# Patient Record
Sex: Female | Born: 1990 | Race: White | Hispanic: No | Marital: Married | Smoking: Never smoker
Health system: Southern US, Community
[De-identification: ages and names within clinical notes are randomized; demographics above are authoritative.]

## PROBLEM LIST (undated history)

## (undated) ENCOUNTER — Inpatient Hospital Stay (HOSPITAL_COMMUNITY): Payer: Self-pay

## (undated) DIAGNOSIS — O020 Blighted ovum and nonhydatidiform mole: Secondary | ICD-10-CM

## (undated) DIAGNOSIS — Z789 Other specified health status: Secondary | ICD-10-CM

## (undated) HISTORY — PX: NO PAST SURGERIES: SHX2092

---

## 2016-06-30 NOTE — L&D Delivery Note (Signed)
Operative Delivery Note At 12:02 AM a viable and healthy female was delivered via Vaginal, [employer], operational(Extractor).  Presentation: vertex; Position: Left,, Occiput,, Anterior; Station: +4.  Verbal consent: obtained from patient.  Risks and benefits discussed in detail.  Risks include, but are not limited to the risks of anesthesia, bleeding, infection, damage to maternal tissues, fetal cephalhematoma.  There is also the risk of inability to effect vaginal delivery of the head, or shoulder dystocia that cannot be resolved by established maneuvers, leading to the need for emergency cesarean section.  APGAR: 8, 9; weight  pending.   Placenta status: spontaneous, intact.   Cord:  with the following complications: none.  Cord pH: na  Anesthesia:  local Instruments: kiwi x 2 pulls, no pop offs Episiotomy:  midline Lacerations:  none Suture Repair: 2.0 vicryl Est. Blood Loss (mL):  500  Mom to postpartum.  Baby to Couplet care / Skin to Skin.  Diasia Henken J 06/02/2017, 12:10 AM

## 2016-07-30 ENCOUNTER — Encounter (HOSPITAL_COMMUNITY): Payer: Self-pay

## 2016-07-30 ENCOUNTER — Inpatient Hospital Stay (HOSPITAL_COMMUNITY): Payer: PRIVATE HEALTH INSURANCE

## 2016-07-30 ENCOUNTER — Inpatient Hospital Stay (HOSPITAL_COMMUNITY)
Admission: AD | Admit: 2016-07-30 | Discharge: 2016-07-30 | Disposition: A | Discharge status: Home / Self Care | Payer: PRIVATE HEALTH INSURANCE | Source: Ambulatory Visit | Attending: Obstetrics and Gynecology | Admitting: Obstetrics and Gynecology

## 2016-07-30 DIAGNOSIS — O209 Hemorrhage in early pregnancy, unspecified: Secondary | ICD-10-CM | POA: Diagnosis not present

## 2016-07-30 DIAGNOSIS — O3680X0 Pregnancy with inconclusive fetal viability, not applicable or unspecified: Secondary | ICD-10-CM

## 2016-07-30 DIAGNOSIS — Z3A01 Less than 8 weeks gestation of pregnancy: Secondary | ICD-10-CM | POA: Diagnosis not present

## 2016-07-30 HISTORY — DX: Other specified health status: Z78.9

## 2016-07-30 LAB — URINALYSIS, ROUTINE W REFLEX MICROSCOPIC
BACTERIA UA: NONE SEEN
Bilirubin Urine: NEGATIVE
Glucose, UA: NEGATIVE mg/dL
Ketones, ur: 80 mg/dL — AB
Leukocytes, UA: NEGATIVE
Nitrite: NEGATIVE
Protein, ur: 30 mg/dL — AB
SPECIFIC GRAVITY, URINE: 1.025 (ref 1.005–1.030)
pH: 5 (ref 5.0–8.0)

## 2016-07-30 LAB — INFLUENZA PANEL BY PCR (TYPE A & B)
INFLAPCR: NEGATIVE
Influenza B By PCR: NEGATIVE

## 2016-07-30 LAB — CBC
HCT: 40 % (ref 36.0–46.0)
Hemoglobin: 14.1 g/dL (ref 12.0–15.0)
MCH: 33 pg (ref 26.0–34.0)
MCHC: 35.3 g/dL (ref 30.0–36.0)
MCV: 93.7 fL (ref 78.0–100.0)
PLATELETS: 194 10*3/uL (ref 150–400)
RBC: 4.27 MIL/uL (ref 3.87–5.11)
RDW: 12.2 % (ref 11.5–15.5)
WBC: 6.5 10*3/uL (ref 4.0–10.5)

## 2016-07-30 LAB — ABO/RH: ABO/RH(D): O POS

## 2016-07-30 LAB — HCG, QUANTITATIVE, PREGNANCY: hCG, Beta Chain, Quant, S: 25 m[IU]/mL — ABNORMAL HIGH (ref <5)

## 2016-07-30 LAB — POCT PREGNANCY, URINE: Preg Test, Ur: POSITIVE — AB

## 2016-07-30 MED ORDER — DEXTROSE 5 % IN LACTATED RINGERS IV BOLUS: 1000.0000 mL | Freq: Once | INTRAVENOUS | Status: DC

## 2016-07-30 NOTE — MAU Provider Note (Signed)
History     CSN: [id number]  Arrival date and time: 07/30/16 1716   First Provider Initiated Contact with Patient 07/30/16 1829      Chief Complaint  Patient presents with  . Vaginal Bleeding   HPI   Ms.Tammy Holmes is a 26 y.o. female G1P0 @ 3175w6d presenting to MAU with vaginal bleeding and abdominal pain. The pain is located in her lower abdomen and feels like menstrual cramping. Her bleeding is bright red and similar to a menstrual cycle.   The patient also reports a 4 day history of worsening soar throat, malaise, nasal congestion, chills, and productive cough.  Her cough is productive with clear mucus..She reports chills, but never specifically checked her temperature. She reports reduced PO intake since becoming ill. She recalls sick contacts, and has not had her flu vaccine done.   OB History    Gravida Para Term Preterm AB Living   1             SAB TAB Ectopic Multiple Live Births                  Past Medical History:  Diagnosis Date  . Medical history non-contributory     Past Surgical History:  Procedure Laterality Date  . NO PAST SURGERIES      History reviewed. No pertinent family history.  Social History  Substance Use Topics  . Smoking status: Never Smoker  . Smokeless tobacco: Never Used  . Alcohol use No    Allergies: No Known Allergies  Prescriptions Prior to Admission  Medication Sig Dispense Refill Last Dose  . Prenatal Vit-Fe Fumarate-FA (PRENATAL MULTIVITAMIN) TABS tablet Take 1 tablet by mouth daily at 12 noon.   07/30/2016 at Unknown time   Results for orders placed or performed during the hospital encounter of 07/30/16 (from the past 48 hour(s))  Urinalysis, Routine w reflex microscopic     Status: Abnormal   Collection Time: 07/30/16  5:30 PM  Result Value Ref Range   Color, Urine YELLOW YELLOW   APPearance HAZY (A) CLEAR   Specific Gravity, Urine 1.025 1.005 - 1.030   pH 5.0 5.0 - 8.0   Glucose, UA NEGATIVE NEGATIVE mg/dL   Hgb  urine dipstick LARGE (A) NEGATIVE   Bilirubin Urine NEGATIVE NEGATIVE   Ketones, ur 80 (A) NEGATIVE mg/dL   Protein, ur 30 (A) NEGATIVE mg/dL   Nitrite NEGATIVE NEGATIVE   Leukocytes, UA NEGATIVE NEGATIVE   RBC / HPF TOO NUMEROUS TO COUNT 0 - 5 RBC/hpf   WBC, UA 0-5 0 - 5 WBC/hpf   Bacteria, UA NONE SEEN NONE SEEN   Squamous Epithelial / LPF 0-5 (A) NONE SEEN   Mucous PRESENT   Pregnancy, urine POC     Status: Abnormal   Collection Time: 07/30/16  5:57 PM  Result Value Ref Range   Preg Test, Ur POSITIVE (A) NEGATIVE    Comment:        THE SENSITIVITY OF THIS METHODOLOGY IS >24 mIU/mL   CBC Once     Status: None   Collection Time: 07/30/16  7:09 PM  Result Value Ref Range   WBC 6.5 4.0 - 10.5 K/uL   RBC 4.27 3.87 - 5.11 MIL/uL   Hemoglobin 14.1 12.0 - 15.0 g/dL   HCT 40.940.0 81.136.0 - 91.446.0 %   MCV 93.7 78.0 - 100.0 fL   MCH 33.0 26.0 - 34.0 pg   MCHC 35.3 30.0 - 36.0 g/dL   RDW 78.212.2 95.611.5 -  15.5 %   Platelets 194 150 - 400 K/uL  ABO/Rh     Status: None (Preliminary result)   Collection Time: 07/30/16  7:09 PM  Result Value Ref Range   ABO/RH(D) O POS   hCG, quantitative, pregnancy     Status: Abnormal   Collection Time: 07/30/16  7:09 PM  Result Value Ref Range   hCG, Beta Chain, Quant, S 25 (H) <5 mIU/mL    Comment:          GEST. AGE      CONC.  (mIU/mL)   <=1 WEEK        5 - 50     2 WEEKS       50 - 500     3 WEEKS       100 - 10,000     4 WEEKS     1,000 - 30,000     5 WEEKS     3,500 - 115,000   6-8 WEEKS     12,000 - 270,000    12 WEEKS     15,000 - 220,000        FEMALE AND NON-PREGNANT FEMALE:     LESS THAN 5 mIU/mL   Influenza panel by PCR (type A & B)     Status: None   Collection Time: 07/30/16  7:17 PM  Result Value Ref Range   Influenza A By PCR NEGATIVE NEGATIVE   Influenza B By PCR NEGATIVE NEGATIVE    Comment: (NOTE) The Xpert Xpress Flu assay is intended as an aid in the diagnosis of  influenza and should not be used as a sole basis for treatment.   This  assay is FDA approved for nasopharyngeal swab specimens only. Nasal  washings and aspirates are unacceptable for Xpert Xpress Flu testing.     Review of Systems  Constitutional: Positive for chills. Negative for fever.  Gastrointestinal: Positive for abdominal pain.   Physical Exam   Blood pressure 149/79, pulse 114, temperature 99 F (37.2 C), temperature source Oral, resp. rate 18, last menstrual period 06/19/2016.  Physical Exam  Constitutional: She is oriented to person, place, and time. She appears well-developed and well-nourished. No distress.  HENT:  Head: Normocephalic.  Eyes: Pupils are equal, round, and reactive to light.  Genitourinary:  Genitourinary Comments: Bimanual exam: slightly open, anterior. Small amount of blood noted on exam glove.   Musculoskeletal: Normal range of motion.  Neurological: She is alert and oriented to person, place, and time.  Skin: Skin is warm. She is not diaphoretic.  Psychiatric: Her behavior is normal.    MAU Course  Procedures  None  MDM  Urine shows > 80 ketones. Patient refused IV. PO hydration in MAU ABO Quant CBC Flu swab- negative  [place]   Report given to [place] CNM who assumes care of the patient.   Duane Lope, NP   US Ob Comp Less 14 Wks  Result Date: 07/30/2016 CLINICAL DATA:  Acute onset of vaginal bleeding.  Initial encounter. EXAM: OBSTETRIC <14 WK [place] AND TRANSVAGINAL OB US TECHNIQUE: Both transabdominal and transvaginal ultrasound examinations were performed for complete evaluation of the gestation as well as the maternal uterus, adnexal regions, and pelvic cul-de-sac. Transvaginal technique was performed to assess early pregnancy. COMPARISON:  None. FINDINGS: Intrauterine gestational sac: None seen. Yolk sac:  N/A Embryo:  N/A Subchorionic hemorrhage:  None visualized. Maternal uterus/adnexae: The uterus is unremarkable in appearance. The endometrial echo complex measures 5 mm the thickness.  The  ovaries are within normal limits. The right ovary measures 2.9 x 1.9 x 2.1 cm, while the left ovary measures 3.1 x 2.6 x 1.4 cm. No suspicious adnexal masses are seen; there is no evidence for ovarian torsion. Trace free fluid is seen within the pelvic cul-de-sac. IMPRESSION: No intrauterine gestational sac seen. No evidence for ectopic pregnancy. The endometrial echo complex measures 5 mm in thickness, suggesting that the patient is past the luteal phase of the cycle. Electronically Signed   By: Roanna Raider M.D.   On: 07/30/2016 20:27   US Ob Transvaginal  Result Date: 07/30/2016 CLINICAL DATA:  Acute onset of vaginal bleeding.  Initial encounter. EXAM: OBSTETRIC <14 WK [place] AND TRANSVAGINAL OB US TECHNIQUE: Both transabdominal and transvaginal ultrasound examinations were performed for complete evaluation of the gestation as well as the maternal uterus, adnexal regions, and pelvic cul-de-sac. Transvaginal technique was performed to assess early pregnancy. COMPARISON:  None. FINDINGS: Intrauterine gestational sac: None seen. Yolk sac:  N/A Embryo:  N/A Subchorionic hemorrhage:  None visualized. Maternal uterus/adnexae: The uterus is unremarkable in appearance. The endometrial echo complex measures 5 mm the thickness. The ovaries are within normal limits. The right ovary measures 2.9 x 1.9 x 2.1 cm, while the left ovary measures 3.1 x 2.6 x 1.4 cm. No suspicious adnexal masses are seen; there is no evidence for ovarian torsion. Trace free fluid is seen within the pelvic cul-de-sac. IMPRESSION: No intrauterine gestational sac seen. No evidence for ectopic pregnancy. The endometrial echo complex measures 5 mm in thickness, suggesting that the patient is past the luteal phase of the cycle. Electronically Signed   By: Roanna Raider M.D.   On: 07/30/2016 20:27     Assessment and Plan   1. Pregnancy of unknown anatomic location   2. Vaginal bleeding in pregnancy, first trimester    Plan: D/C home in stable  condition.  Ectopic/SAB precautions Follow-up Information    [facility] [place] [facility] ADMISSIONS Follow up on 08/02/2016.   Why:  for repeat bloodwork or sooner as needed in emergencies Contact information: [address] [id number] mc [place] [address] [phone]         Allergies as of 07/30/2016   No Known Allergies     Medication List    TAKE these medications   prenatal multivitamin Tabs tablet Take 1 tablet by mouth daily at 12 noon.         [place], CNM 07/30/2016 9:08 PM

## 2016-07-30 NOTE — Discharge Instructions (Signed)
Threatened Miscarriage °A threatened miscarriage occurs when you have vaginal bleeding during your first 20 weeks of pregnancy but the pregnancy has not ended. If you have vaginal bleeding during this time, your health care provider will do tests to make sure you are still pregnant. If the tests show you are still pregnant and the developing baby (fetus) inside your womb (uterus) is still growing, your condition is considered a threatened miscarriage. °A threatened miscarriage does not mean your pregnancy will end, but it does increase the risk of losing your pregnancy (complete miscarriage). °What are the causes? °The cause of a threatened miscarriage is usually not known. If you go on to have a complete miscarriage, the most common cause is an abnormal number of chromosomes in the developing baby. Chromosomes are the structures inside cells that hold all your genetic material. °Some causes of vaginal bleeding that do not result in miscarriage include: °· Having sex. °· Having an infection. °· Normal hormone changes of pregnancy. °· Bleeding that occurs when an egg implants in your uterus. °What increases the risk? °Risk factors for bleeding in early pregnancy include: °· Obesity. °· Smoking. °· Drinking excessive amounts of alcohol or caffeine. °· Recreational drug use. °What are the signs or symptoms? °· Light vaginal bleeding. °· Mild abdominal pain or cramps. °How is this diagnosed? °If you have bleeding with or without abdominal pain before 20 weeks of pregnancy, your health care provider will do tests to check whether you are still pregnant. One important test involves using sound waves and a computer (ultrasound) to create images of the inside of your uterus. Other tests include an internal exam of your vagina and uterus (pelvic exam) and measurement of your baby’s heart rate. °You may be diagnosed with a threatened miscarriage if: °· Ultrasound testing shows you are still pregnant. °· Your baby’s heart rate  is strong. °· A pelvic exam shows that the opening between your uterus and your vagina (cervix) is closed. °· Your heart rate and blood pressure are stable. °· Blood tests confirm you are still pregnant. °How is this treated? °No treatments have been shown to prevent a threatened miscarriage from going on to a complete miscarriage. However, the right home care is important. °Follow these instructions at home: °· Make sure you keep all your appointments for prenatal care. This is very important. °· Get plenty of rest. °· Do not have sex or use tampons if you have vaginal bleeding. °· Do not douche. °· Do not smoke or use recreational drugs. °· Do not drink alcohol. °· Avoid caffeine. °Contact a health care provider if: °· You have light vaginal bleeding or spotting while pregnant. °· You have abdominal pain or cramping. °· You have a fever. °Get help right away if: °· You have heavy vaginal bleeding. °· You have blood clots coming from your vagina. °· You have severe low back pain or abdominal cramps. °· You have fever, chills, and severe abdominal pain. °This information is not intended to replace advice given to you by your health care provider. Make sure you discuss any questions you have with your health care provider. °Document Released: 06/16/2005 Document Revised: 11/22/2015 Document Reviewed: 04/12/2013 °Elsevier Interactive Patient Education © 2017 [employer] ° °

## 2016-07-30 NOTE — MAU Note (Signed)
Pt presents to MAU with vaginal bleeding that began 2 days ago. Reports yesterday she passed a "clot/tissue,"and now her bleeding seems to be like a normal period. Pt had 2 +HPT about a week ago.  Pt also reports she thinks she has a sinus infection.

## 2016-07-30 NOTE — MAU Provider Note (Cosign Needed)
Chief Complaint:  Vaginal Bleeding   First Provider Initiated Contact with Patient 07/30/16 1829      HPI: Tammy Holmes is a 26 y.o. G1P0 at 6233w6d who presents with her partner at bedside to maternity admissions reporting lower abdominal cramping, vaginal bleeding, and sinus infection. On 1/20, the patient reports she had 2x positive home pregnancy test and reports her LMP to be 06/19/2016. She developed 4/10 lower abdominal cramping & bright red vaginal bleeding on 1/29. Initially the bleeding was slightly above spotting but worsen to greater than her normal menses by the morning of 1/30 & passed a clot this evening. The patient denies dysuria, hematuria, concerning vaginal discharge, vaginal itch/pain, and BM changes.   The patient also reports a 4 day history of worsening soar throat, malaise, nasal congestion, chills, and productive cough. She reports being woken up by chills, post nasal drip, and diaphoresis at night. Her cough is productive for clear mucus. She reports a bitemporal headache and sinus pressure & nasal congestion localized near her nasal bridge which is relieved by applying pressure. She reports tactile fever, but never specifically checked her temperature. She reports reduced PO intake since becoming ill. She recalls sick contacts at her job and has not had her flu vaccine done.    Denies contractions, leakage of fluid or vaginal bleeding. Good fetal movement.   Pregnancy Course:   Past Medical History: Past Medical History:  Diagnosis Date  . Medical history non-contributory     Past obstetric history: OB History  Gravida Para Term Preterm AB Living  1            SAB TAB Ectopic Multiple Live Births               # Outcome Date GA Lbr Len/2nd Weight Sex Delivery Anes PTL Lv  1 Current               Past Surgical History: Past Surgical History:  Procedure Laterality Date  . NO PAST SURGERIES       Family History: History reviewed. No pertinent family  history.  Social History: Social History  Substance Use Topics  . Smoking status: Never Smoker  . Smokeless tobacco: Never Used  . Alcohol use No    Allergies: No Known Allergies  Meds:  Prescriptions Prior to Admission  Medication Sig Dispense Refill Last Dose  . Prenatal Vit-Fe Fumarate-FA (PRENATAL MULTIVITAMIN) TABS tablet Take 1 tablet by mouth daily at 12 noon.   07/30/2016 at Unknown time    I have reviewed patient's Past Medical Hx, Surgical Hx, Family Hx, Social Hx, medications and allergies.   ROS:  A comprehensive ROS was negative except per HPI.    Physical Exam   Patient Vitals for the past 24 hrs:  BP Temp Temp src Pulse Resp  07/30/16 1913 127/87 - - 87 18  07/30/16 1745 149/79 99 F (37.2 C) Oral 114 18   Constitutional: Well-developed, well-nourished female in no acute distress.  HEENT: MMM, Pink Oropharynx without erythema or exudate, no post nasal drip,  no TTP over maxillary or ethmoidal sinuses bilaterally. Cardiovascular: normal rate Respiratory: normal effort GI: Abd soft, non-tender, involuntary guarding with deep palpation in lower quadrants.  Pos BS x 4 MS: Extremities nontender, no edema, normal ROM Neurologic: Alert and oriented x 3.     Labs: Results for orders placed or performed during the hospital encounter of 07/30/16 (from the past 24 hour(s))  Urinalysis, Routine w reflex microscopic  Status: Abnormal   Collection Time: 07/30/16  5:30 PM  Result Value Ref Range   Color, Urine YELLOW YELLOW   APPearance HAZY (A) CLEAR   Specific Gravity, Urine 1.025 1.005 - 1.030   pH 5.0 5.0 - 8.0   Glucose, UA NEGATIVE NEGATIVE mg/dL   Hgb urine dipstick LARGE (A) NEGATIVE   Bilirubin Urine NEGATIVE NEGATIVE   Ketones, ur 80 (A) NEGATIVE mg/dL   Protein, ur 30 (A) NEGATIVE mg/dL   Nitrite NEGATIVE NEGATIVE   Leukocytes, UA NEGATIVE NEGATIVE   RBC / HPF TOO NUMEROUS TO COUNT 0 - 5 RBC/hpf   WBC, UA 0-5 0 - 5 WBC/hpf   Bacteria, UA NONE  SEEN NONE SEEN   Squamous Epithelial / LPF 0-5 (A) NONE SEEN   Mucous PRESENT   Pregnancy, urine POC     Status: Abnormal   Collection Time: 07/30/16  5:57 PM  Result Value Ref Range   Preg Test, Ur POSITIVE (A) NEGATIVE    Imaging:  No results found.  MAU Course: Patient received work up for ectopic pregnancy, STI, UTI and  influenza infection. Held IVF to allow patient to attempt oral rehydration. Patient brought passed clot into office, will have pathology assess for fetal tissue.   MDM: Plan of care reviewed with patient, including labs and tests ordered and medical treatment.   Assessment: 1. Vaginal bleeding in pregnancy, first trimester   2. Vaginal bleeding in pregnancy, first trimester     Plan: Discharge home in stable condition.      Collie Siad, Medical Student MS3 07/30/2016 7:34 PM

## 2016-07-31 LAB — HIV ANTIBODY (ROUTINE TESTING W REFLEX): HIV SCREEN 4TH GENERATION: NONREACTIVE

## 2016-08-01 ENCOUNTER — Inpatient Hospital Stay (HOSPITAL_COMMUNITY)
Admission: AD | Admit: 2016-08-01 | Discharge: 2016-08-01 | Disposition: A | Discharge status: Home / Self Care | Payer: No Typology Code available for payment source | Source: Ambulatory Visit | Attending: Obstetrics and Gynecology | Admitting: Obstetrics and Gynecology

## 2016-08-01 DIAGNOSIS — O208 Other hemorrhage in early pregnancy: Secondary | ICD-10-CM | POA: Diagnosis present

## 2016-08-01 DIAGNOSIS — O039 Complete or unspecified spontaneous abortion without complication: Secondary | ICD-10-CM | POA: Diagnosis not present

## 2016-08-01 DIAGNOSIS — Z3A01 Less than 8 weeks gestation of pregnancy: Secondary | ICD-10-CM | POA: Insufficient documentation

## 2016-08-01 LAB — HCG, QUANTITATIVE, PREGNANCY: hCG, Beta Chain, Quant, S: 10 m[IU]/mL — ABNORMAL HIGH (ref <5)

## 2016-08-01 NOTE — Discharge Instructions (Signed)

## 2016-08-01 NOTE — MAU Note (Signed)
Pt here for repeat BHCG, is bleeding today like a light period, continues to have cramping/belly ache.

## 2016-08-01 NOTE — Progress Notes (Signed)
Pt not in lobby.  Called pt to discuss results. Left voicemail to return call.

## 2016-08-01 NOTE — MAU Provider Note (Signed)
History   [id number]   Chief Complaint  Patient presents with  . Follow-up    HPI Tammy Holmes is a 26 y.o. female G1P0 here for follow-up BHCG.  Upon review of the records patient was first seen on 1/31 for vaginal bleeding. BHCG on that day was 25.  Ultrasound showed no IUP or adnexal mass. Pt discharged home in stable condition to f/u in 48 hrs for repeat BHCG. Pt here today with no report of abdominal pain and some vaginal bleeding.   All other systems negative.   Patient's last menstrual period was 06/19/2016.  OB History  Gravida Para Term Preterm AB Living  1            SAB TAB Ectopic Multiple Live Births               # Outcome Date GA Lbr Len/2nd Weight Sex Delivery Anes PTL Lv  1 Current               Past Medical History:  Diagnosis Date  . Medical history non-contributory     No family history on file.  Social History   Social History  . Marital status: Married    Spouse name: N/A  . Number of children: N/A  . Years of education: N/A   Social History Main Topics  . Smoking status: Never Smoker  . Smokeless tobacco: Never Used  . Alcohol use No  . Drug use: No  . Sexual activity: Yes   Other Topics Concern  . Not on file   Social History Narrative  . No narrative on file    No Known Allergies  No current facility-administered medications on file prior to encounter.    Current Outpatient Prescriptions on File Prior to Encounter  Medication Sig Dispense Refill  . Prenatal Vit-Fe Fumarate-FA (PRENATAL MULTIVITAMIN) TABS tablet Take 1 tablet by mouth daily at 12 noon.       Physical Exam   Vitals:   08/01/16 1718  BP: 116/67  Pulse: 71  Resp: 16  Temp: 98.2 F (36.8 C)  TempSrc: Oral    Physical Exam  Nursing note and vitals reviewed. Constitutional: She is oriented to person, place, and time. She appears well-developed and well-nourished. No distress.  HENT:  Head: Normocephalic and atraumatic.  Eyes: Conjunctivae are normal.  Right eye exhibits no discharge. Left eye exhibits no discharge. No scleral icterus.  Neck: Normal range of motion.  Respiratory: Effort normal. No respiratory distress.  Neurological: She is alert and oriented to person, place, and time.  Skin: Skin is warm and dry. She is not diaphoretic.  Psychiatric: She has a normal mood and affect. Her behavior is normal. Judgment and thought content normal.    MAU Course  Procedures Results for orders placed or performed during the hospital encounter of 08/01/16 (from the past 24 hour(s))  hCG, quantitative, pregnancy     Status: Abnormal   Collection Time: 08/01/16  4:53 PM  Result Value Ref Range   hCG, Beta Chain, Quant, S 10 (H) <5 mIU/mL    MDM Component     Latest Ref Rng & Units 07/30/2016 08/01/2016  HCG, Beta Chain, Quant, S     <5 mIU/mL 25 (H) 10 (H)   Drop in BHCG indicative of SAB. Will have pt f/u at [facility] in a week for BHCG  Assessment and Plan  26 y.o. G1P0 at [redacted]w[redacted]d wks Pregnancy Follow-up BHCG 1. Miscarriage    P: [facility]charge [facility] bhcg in 1 week Pelvic rest Discussed reasons to return to MAU   Judeth HornErin Kinzlee Selvy, NP 08/01/2016 5:57 PM

## 2016-08-07 ENCOUNTER — Inpatient Hospital Stay (HOSPITAL_COMMUNITY)
Admission: AD | Admit: 2016-08-07 | Discharge: 2016-08-07 | Disposition: A | Discharge status: Home / Self Care | Payer: No Typology Code available for payment source | Source: Ambulatory Visit | Attending: Obstetrics and Gynecology | Admitting: Obstetrics and Gynecology

## 2016-08-07 ENCOUNTER — Encounter (HOSPITAL_COMMUNITY): Payer: Self-pay | Admitting: Student

## 2016-08-07 ENCOUNTER — Other Ambulatory Visit: Payer: No Typology Code available for payment source

## 2016-08-07 DIAGNOSIS — Z3202 Encounter for pregnancy test, result negative: Secondary | ICD-10-CM | POA: Diagnosis not present

## 2016-08-07 DIAGNOSIS — Z3A01 Less than 8 weeks gestation of pregnancy: Secondary | ICD-10-CM | POA: Insufficient documentation

## 2016-08-07 DIAGNOSIS — O039 Complete or unspecified spontaneous abortion without complication: Secondary | ICD-10-CM | POA: Insufficient documentation

## 2016-08-07 LAB — HCG, QUANTITATIVE, PREGNANCY: HCG, BETA CHAIN, QUANT, S: 2 m[IU]/mL (ref <5)

## 2016-08-07 NOTE — Discharge Instructions (Signed)
Miscarriage A miscarriage is the loss of an unborn baby (fetus) before the 20th week of pregnancy. The cause is often unknown. Follow these instructions at home:  You may need to stay in bed (bed rest), or you may be able to do light activity. Go about activity as told by your doctor.  Have help at home.  Write down how many pads you use each day. Write down how soaked they are.  Do not use tampons. Do not wash out your vagina (douche) or have sex (intercourse) until your doctor approves.  Only take medicine as told by your doctor.  Do not take aspirin.  Keep all doctor visits as told.  If you or your partner have problems with grieving, talk to your doctor. You can also try counseling. Give yourself time to grieve before trying to get pregnant again. Get help right away if:  You have bad cramps or pain in your back or belly (abdomen).  You have a fever.  You pass large clumps of blood (clots) from your vagina that are walnut-sized or larger. Save the clumps for your doctor to see.  You pass large amounts of tissue from your vagina. Save the tissue for your doctor to see.  You have more bleeding.  You have thick, bad-smelling fluid (discharge) coming from the vagina.  You get lightheaded, weak, or you pass out (faint).  You have chills. This information is not intended to replace advice given to you by your health care provider. Make sure you discuss any questions you have with your health care provider. Document Released: 09/08/2011 Document Revised: 11/22/2015 Document Reviewed: 07/17/2011 Elsevier Interactive Patient Education  2017 [place] [employer], Female Introduction Adopting a healthy lifestyle and getting preventive care can go a long way to promote health and wellness. Talk with your health care provider about what schedule of regular examinations is right for you. This is a good chance for you to check in with your provider about disease prevention  and staying healthy. In between checkups, there are plenty of things you can do on your own. Experts have done a lot of research about which lifestyle changes and preventive measures are most likely to keep you healthy. Ask your health care provider for more information. Weight and diet Eat a healthy diet  Be sure to include plenty of vegetables, fruits, low-fat dairy products, and lean protein.  Do not eat a lot of foods high in solid fats, added sugars, or salt.  Get regular exercise. This is one of the most important things you can do for your health.  Most adults should exercise for at least 150 minutes each week. The exercise should increase your heart rate and make you sweat (moderate-intensity exercise).  Most adults should also do strengthening exercises at least twice a week. This is in addition to the moderate-intensity exercise. Maintain a healthy weight  Body mass index (BMI) is a measurement that can be used to identify possible weight problems. It estimates body fat based on height and weight. Your health care provider can help determine your BMI and help you achieve or maintain a healthy weight.  For females 34 years of age and older:  A BMI below 18.5 is considered underweight.  A BMI of 18.5 to 24.9 is normal.  A BMI of 25 to 29.9 is considered overweight.  A BMI of 30 and above is considered obese. Watch levels of cholesterol and blood lipids  You should start having your blood tested for  lipids and cholesterol at 25 years of age, then have this test every 5 years.  You may need to have your cholesterol levels checked more often if:  Your lipid or cholesterol levels are high.  You are older than 25 years of age.  You are at high risk for heart disease. Cancer screening Lung Cancer  Lung cancer screening is recommended for adults 61-79 years old who are at high risk for lung cancer because of a history of smoking.  A yearly low-dose CT scan of the lungs is  recommended for people who:  Currently smoke.  Have quit within the past 15 years.  Have at least a 30-pack-year history of smoking. A pack year is smoking an average of one pack of cigarettes a day for 1 year.  Yearly screening should continue until it has been 15 years since you quit.  Yearly screening should stop if you develop a health problem that would prevent you from having lung cancer treatment. Breast Cancer  Practice breast self-awareness. This means understanding how your breasts normally appear and feel.  It also means doing regular breast self-exams. Let your health care provider know about any changes, no matter how small.  If you are in your 20s or 30s, you should have a clinical breast exam (CBE) by a health care provider every 1-3 years as part of a regular health exam.  If you are 73 or older, have a CBE every year. Also consider having a breast X-ray (mammogram) every year.  If you have a family history of breast cancer, talk to your health care provider about genetic screening.  If you are at high risk for breast cancer, talk to your health care provider about having an MRI and a mammogram every year.  Breast cancer gene (BRCA) assessment is recommended for women who have family members with BRCA-related cancers. BRCA-related cancers include:  Breast.  Ovarian.  Tubal.  Peritoneal cancers.  Results of the assessment will determine the need for genetic counseling and BRCA1 and BRCA2 testing. Cervical Cancer  Your health care provider may recommend that you be screened regularly for cancer of the pelvic organs (ovaries, uterus, and vagina). This screening involves a pelvic examination, including checking for microscopic changes to the surface of your cervix (Pap test). You may be encouraged to have this screening done every 3 years, beginning at age 66.  For women ages 28-65, health care providers may recommend pelvic exams and Pap testing every 3 years, or  they may recommend the Pap and pelvic exam, combined with testing for human papilloma virus (HPV), every 5 years. Some types of HPV increase your risk of cervical cancer. Testing for HPV may also be done on women of any age with unclear Pap test results.  Other health care providers may not recommend any screening for nonpregnant women who are considered low risk for pelvic cancer and who do not have symptoms. Ask your health care provider if a screening pelvic exam is right for you.  If you have had past treatment for cervical cancer or a condition that could lead to cancer, you need Pap tests and screening for cancer for at least 20 years after your treatment. If Pap tests have been discontinued, your risk factors (such as having a new sexual partner) need to be reassessed to determine if screening should resume. Some women have medical problems that increase the chance of getting cervical cancer. In these cases, your health care provider may recommend more frequent screening  and Pap tests. Colorectal Cancer  This type of cancer can be detected and often prevented.  Routine colorectal cancer screening usually begins at 25 years of age and continues through 25 years of age.  Your health care provider may recommend screening at an earlier age if you have risk factors for colon cancer.  Your health care provider may also recommend using home test kits to check for hidden blood in the stool.  A small camera at the end of a tube can be used to examine your colon directly (sigmoidoscopy or colonoscopy). This is done to check for the earliest forms of colorectal cancer.  Routine screening usually begins at age 61.  Direct examination of the colon should be repeated every 5-10 years through 25 years of age. However, you may need to be screened more often if early forms of precancerous polyps or small growths are found. Skin Cancer  Check your skin from head to toe regularly.  Tell your health care  provider about any new moles or changes in moles, especially if there is a change in a mole's shape or color.  Also tell your health care provider if you have a mole that is larger than the size of a pencil eraser.  Always use sunscreen. Apply sunscreen liberally and repeatedly throughout the day.  Protect yourself by wearing long sleeves, pants, a wide-brimmed hat, and sunglasses whenever you are outside. Heart disease, diabetes, and high blood pressure  High blood pressure causes heart disease and increases the risk of stroke. High blood pressure is more likely to develop in:  People who have blood pressure in the high end of the normal range (130-139/85-89 mm Hg).  People who are overweight or obese.  People who are African American.  If you are 96-13 years of age, have your blood pressure checked every 3-5 years. If you are 43 years of age or older, have your blood pressure checked every year. You should have your blood pressure measured twice--once when you are at a hospital or clinic, and once when you are not at a hospital or clinic. Record the average of the two measurements. To check your blood pressure when you are not at a hospital or clinic, you can use:  An automated blood pressure machine at a pharmacy.  A home blood pressure monitor.  If you are between 46 years and 59 years old, ask your health care provider if you should take aspirin to prevent strokes.  Have regular diabetes screenings. This involves taking a blood sample to check your fasting blood sugar level.  If you are at a normal weight and have a low risk for diabetes, have this test once every three years after 25 years of age.  If you are overweight and have a high risk for diabetes, consider being tested at a younger age or more often. Preventing infection Hepatitis B  If you have a higher risk for hepatitis B, you should be screened for this virus. You are considered at high risk for hepatitis B  if:  You were born in a country where hepatitis B is common. Ask your health care provider which countries are considered high risk.  Your parents were born in a high-risk country, and you have not been immunized against hepatitis B (hepatitis B vaccine).  You have HIV or AIDS.  You use needles to inject street drugs.  You live with someone who has hepatitis B.  You have had sex with someone who has hepatitis B.  You get hemodialysis treatment.  You take certain medicines for conditions, including cancer, organ transplantation, and autoimmune conditions. Hepatitis C  Blood testing is recommended for:  Everyone born from [birth year] through 1964.  Anyone with known risk factors for hepatitis C. Sexually transmitted infections (STIs)  You should be screened for sexually transmitted infections (STIs) including gonorrhea and chlamydia if:  You are sexually active and are younger than 25 years of age.  You are older than 25 years of age and your health care provider tells you that you are at risk for this type of infection.  Your sexual activity has changed since you were last screened and you are at an increased risk for chlamydia or gonorrhea. Ask your health care provider if you are at risk.  If you do not have HIV, but are at risk, it may be recommended that you take a prescription medicine daily to prevent HIV infection. This is called pre-exposure prophylaxis (PrEP). You are considered at risk if:  You are sexually active and do not regularly use condoms or know the HIV status of your partner(s).  You take drugs by injection.  You are sexually active with a partner who has HIV. Talk with your health care provider about whether you are at high risk of being infected with HIV. If you choose to begin PrEP, you should first be tested for HIV. You should then be tested every 3 months for as long as you are taking PrEP. Pregnancy  If you are premenopausal and you may become pregnant,  ask your health care provider about preconception counseling.  If you may become pregnant, take 400 to 800 micrograms (mcg) of folic acid every day.  If you want to prevent pregnancy, talk to your health care provider about birth control (contraception). Osteoporosis and menopause  Osteoporosis is a disease in which the bones lose minerals and strength with aging. This can result in serious bone fractures. Your risk for osteoporosis can be identified using a bone density scan.  If you are 90 years of age or older, or if you are at risk for osteoporosis and fractures, ask your health care provider if you should be screened.  Ask your health care provider whether you should take a calcium or vitamin D supplement to lower your risk for osteoporosis.  Menopause may have certain physical symptoms and risks.  Hormone replacement therapy may reduce some of these symptoms and risks. Talk to your health care provider about whether hormone replacement therapy is right for you. Follow these instructions at home:  Schedule regular health, dental, and eye exams.  Stay current with your immunizations.  Do not use any tobacco products including cigarettes, chewing tobacco, or electronic cigarettes.  If you are pregnant, do not drink alcohol.  If you are breastfeeding, limit how much and how often you drink alcohol.  Limit alcohol intake to no more than 1 drink per day for nonpregnant women. One drink equals 12 ounces of beer, 5 ounces of wine, or 1 ounces of hard liquor.  Do not use street drugs.  Do not share needles.  Ask your health care provider for help if you need support or information about quitting drugs.  Tell your health care provider if you often feel depressed.  Tell your health care provider if you have ever been abused or do not feel safe at home. This information is not intended to replace advice given to you by your health care provider. Make sure you discuss any questions  you have with your health care provider. Document Released: 12/30/2010 Document Revised: 11/22/2015 Document Reviewed: 03/20/2015  2017 Elsevier

## 2016-08-07 NOTE — MAU Provider Note (Signed)
History   [id number]   Chief Complaint  Patient presents with  . Follow-up    HPI Tammy Holmes is a 26 y.o. female G1P0 here for follow-up BHCG.  Upon review of the records patient was first seen on 1/31 for vaginal bleeding. BHCG on that day was 25.  Ultrasound showed no IUP or adnexal mass. Pt discharged home in stable condition, to f/u for repeat BHCG. Pt here today with no report of abdominal pain or vaginal bleeding. All other systems negative.  Last seen in MAU on 2/2. BHCG was 10.    Patient's last menstrual period was 06/19/2016.  OB History  Gravida Para Term Preterm AB Living  1            SAB TAB Ectopic Multiple Live Births               # Outcome Date GA Lbr Len/2nd Weight Sex Delivery Anes PTL Lv  1 Current               Past Medical History:  Diagnosis Date  . Medical history non-contributory     No family history on file.  Social History   Social History  . Marital status: Married    Spouse name: N/A  . Number of children: N/A  . Years of education: N/A   Social History Main Topics  . Smoking status: Never Smoker  . Smokeless tobacco: Never Used  . Alcohol use No  . Drug use: No  . Sexual activity: Yes   Other Topics Concern  . Not on file   Social History Narrative  . No narrative on file    No Known Allergies  No current facility-administered medications on file prior to encounter.    Current Outpatient Prescriptions on File Prior to Encounter  Medication Sig Dispense Refill  . Prenatal Vit-Fe Fumarate-FA (PRENATAL MULTIVITAMIN) TABS tablet Take 1 tablet by mouth daily at 12 noon.       Physical Exam   Vitals:   08/07/16 1630  BP: 104/66  Pulse: 64  Resp: 18  Temp: 98.6 F (37 C)    Physical Exam  Nursing note and vitals reviewed. Constitutional: She is oriented to person, place, and time. She appears well-developed and well-nourished. No distress.  HENT:  Head: Normocephalic and atraumatic.  Eyes: Conjunctivae are  normal. Right eye exhibits no discharge. Left eye exhibits no discharge. No scleral icterus.  Neck: Normal range of motion.  Respiratory: Effort normal. No respiratory distress.  Musculoskeletal: Normal range of motion.  Neurological: She is alert and oriented to person, place, and time.  Skin: Skin is warm and dry. She is not diaphoretic.  Psychiatric: She has a normal mood and affect. Her behavior is normal. Judgment and thought content normal.    MAU Course  Procedures Results for orders placed or performed during the hospital encounter of 08/07/16 (from the past 24 hour(s))  hCG, quantitative, pregnancy     Status: None   Collection Time: 08/07/16  4:29 PM  Result Value Ref Range   hCG, Beta Chain, Quant, S 2 <5 mIU/mL    MDM O positive Component     Latest Ref Rng & Units 07/30/2016 08/01/2016 08/07/2016  HCG, Beta Chain, Quant, S     <5 mIU/mL 25 (H) 10 (H) 2    Assessment and Plan  26 y.o. G1P0 at [redacted]w[redacted]d wks Pregnancy Follow-up BHCG 1. Negative pregnancy test   2. Miscarriage     P: Discharge home Ob/gyn  provider list given  F/u with PCP or gyn for routine care   Judeth HornErin Elizjah Noblet, NP 08/07/2016 6:10 PM

## 2016-08-07 NOTE — MAU Note (Signed)
Pt here for follow up BHCG. Pt reports bleeding is moe brown today. Having some cramping today. Unsure if it is worse than it was the other day.

## 2016-12-25 LAB — OB RESULTS CONSOLE RUBELLA ANTIBODY, IGM
RUBELLA: IMMUNE
Rubella: IMMUNE

## 2016-12-25 LAB — OB RESULTS CONSOLE HIV ANTIBODY (ROUTINE TESTING)
HIV: NONREACTIVE
HIV: NONREACTIVE

## 2016-12-25 LAB — OB RESULTS CONSOLE HEPATITIS B SURFACE ANTIGEN
HEP B S AG: NEGATIVE
HEP B S AG: NEGATIVE

## 2016-12-25 LAB — OB RESULTS CONSOLE ABO/RH: RH TYPE: POSITIVE

## 2016-12-25 LAB — OB RESULTS CONSOLE ANTIBODY SCREEN: Antibody Screen: NEGATIVE

## 2016-12-25 LAB — OB RESULTS CONSOLE RPR
RPR: NONREACTIVE
RPR: NONREACTIVE

## 2016-12-25 LAB — OB RESULTS CONSOLE GC/CHLAMYDIA
CHLAMYDIA, DNA PROBE: NEGATIVE
Gonorrhea: NEGATIVE

## 2017-05-08 LAB — OB RESULTS CONSOLE GBS: GBS: POSITIVE

## 2017-06-01 ENCOUNTER — Encounter (HOSPITAL_COMMUNITY): Payer: Self-pay | Admitting: Anesthesiology

## 2017-06-01 ENCOUNTER — Inpatient Hospital Stay (HOSPITAL_COMMUNITY)
Admission: AD | Admit: 2017-06-01 | Discharge: 2017-06-04 | DRG: 807 | Disposition: A | Discharge status: Home / Self Care | Payer: No Typology Code available for payment source | Source: Ambulatory Visit | Attending: Obstetrics and Gynecology | Admitting: Obstetrics and Gynecology

## 2017-06-01 ENCOUNTER — Encounter (HOSPITAL_COMMUNITY): Payer: Self-pay | Admitting: General Practice

## 2017-06-01 DIAGNOSIS — O99824 Streptococcus B carrier state complicating childbirth: Secondary | ICD-10-CM | POA: Diagnosis present

## 2017-06-01 DIAGNOSIS — O1204 Gestational edema, complicating childbirth: Secondary | ICD-10-CM | POA: Diagnosis present

## 2017-06-01 DIAGNOSIS — Z3A39 39 weeks gestation of pregnancy: Secondary | ICD-10-CM | POA: Diagnosis not present

## 2017-06-01 DIAGNOSIS — O2243 Hemorrhoids in pregnancy, third trimester: Secondary | ICD-10-CM | POA: Diagnosis present

## 2017-06-01 DIAGNOSIS — Z8759 Personal history of other complications of pregnancy, childbirth and the puerperium: Secondary | ICD-10-CM

## 2017-06-01 DIAGNOSIS — R030 Elevated blood-pressure reading, without diagnosis of hypertension: Secondary | ICD-10-CM | POA: Diagnosis present

## 2017-06-01 DIAGNOSIS — O26893 Other specified pregnancy related conditions, third trimester: Secondary | ICD-10-CM | POA: Diagnosis present

## 2017-06-01 LAB — CBC
HEMATOCRIT: 41.8 % (ref 36.0–46.0)
HEMOGLOBIN: 14.3 g/dL (ref 12.0–15.0)
MCH: 33.7 pg (ref 26.0–34.0)
MCHC: 34.2 g/dL (ref 30.0–36.0)
MCV: 98.6 fL (ref 78.0–100.0)
Platelets: 212 10*3/uL (ref 150–400)
RBC: 4.24 MIL/uL (ref 3.87–5.11)
RDW: 13.6 % (ref 11.5–15.5)
WBC: 9.4 10*3/uL (ref 4.0–10.5)

## 2017-06-01 LAB — COMPREHENSIVE METABOLIC PANEL
ALK PHOS: 141 U/L — AB (ref 38–126)
ALT: 11 U/L — ABNORMAL LOW (ref 14–54)
ANION GAP: 11 (ref 5–15)
AST: 22 U/L (ref 15–41)
Albumin: 3 g/dL — ABNORMAL LOW (ref 3.5–5.0)
BILIRUBIN TOTAL: 0.6 mg/dL (ref 0.3–1.2)
BUN: 14 mg/dL (ref 6–20)
CALCIUM: 9.4 mg/dL (ref 8.9–10.3)
CO2: 17 mmol/L — ABNORMAL LOW (ref 22–32)
CREATININE: 0.7 mg/dL (ref 0.44–1.00)
Chloride: 108 mmol/L (ref 101–111)
GFR calc non Af Amer: >60 mL/min (ref >60)
GLUCOSE: 61 mg/dL — AB (ref 65–99)
Potassium: 4.5 mmol/L (ref 3.5–5.1)
Sodium: 136 mmol/L (ref 135–145)
TOTAL PROTEIN: 6.2 g/dL — AB (ref 6.5–8.1)

## 2017-06-01 LAB — TYPE AND SCREEN
ABO/RH(D): O POS
ANTIBODY SCREEN: NEGATIVE

## 2017-06-01 LAB — PROTEIN / CREATININE RATIO, URINE
Creatinine, Urine: 184 mg/dL
Protein Creatinine Ratio: 0.33 mg/mg{Cre} — ABNORMAL HIGH (ref 0.00–0.15)
TOTAL PROTEIN, URINE: 61 mg/dL

## 2017-06-01 LAB — URIC ACID: Uric Acid, Serum: 7.6 mg/dL — ABNORMAL HIGH (ref 2.3–6.6)

## 2017-06-01 MED ORDER — PENICILLIN G POT IN DEXTROSE 60000 UNIT/ML IV SOLN
3.0000 10*6.[IU] | INTRAVENOUS | Status: DC
  Administered 2017-06-01: 3 10*6.[IU] via INTRAVENOUS
  Filled 2017-06-01 (×5): qty 50

## 2017-06-01 MED ORDER — OXYTOCIN BOLUS FROM INFUSION
500.0000 mL | Freq: Once | INTRAVENOUS | Status: AC
  Administered 2017-06-02: 500 mL via INTRAVENOUS

## 2017-06-01 MED ORDER — LACTATED RINGERS IV SOLN
INTRAVENOUS | Status: DC
  Administered 2017-06-01: 17:00:00 via INTRAVENOUS

## 2017-06-01 MED ORDER — PENICILLIN G POTASSIUM 5000000 UNITS IJ SOLR
5.0000 10*6.[IU] | Freq: Once | INTRAVENOUS | Status: AC
  Administered 2017-06-01: 5 10*6.[IU] via INTRAVENOUS
  Filled 2017-06-01: qty 5

## 2017-06-01 MED ORDER — LACTATED RINGERS IV SOLN: 500.0000 mL | INTRAVENOUS | Status: DC | PRN

## 2017-06-01 MED ORDER — ONDANSETRON HCL 4 MG/2ML IJ SOLN: 4.0000 mg | Freq: Four times a day (QID) | INTRAMUSCULAR | Status: DC | PRN

## 2017-06-01 MED ORDER — SOD CITRATE-CITRIC ACID 500-334 MG/5ML PO SOLN: 30.0000 mL | ORAL | Status: DC | PRN

## 2017-06-01 MED ORDER — OXYTOCIN 40 UNITS IN LACTATED RINGERS INFUSION - SIMPLE MED
2.5000 [IU]/h | INTRAVENOUS | Status: DC
  Administered 2017-06-02: 2.5 [IU]/h via INTRAVENOUS
  Filled 2017-06-01: qty 1000

## 2017-06-01 MED ORDER — LIDOCAINE HCL (PF) 1 % IJ SOLN
30.0000 mL | INTRAMUSCULAR | Status: AC | PRN
  Administered 2017-06-02 (×2): 30 mL via SUBCUTANEOUS
  Filled 2017-06-01 (×2): qty 30

## 2017-06-01 MED ORDER — ACETAMINOPHEN 325 MG PO TABS: 650.0000 mg | ORAL_TABLET | ORAL | Status: DC | PRN

## 2017-06-01 NOTE — Progress Notes (Signed)
S:  Pt. In tub at 6:24pm. More comfortable in the water. BPs initially elevated on admission likely related to pain response. No neural s/s, no RUQ, HA or visual changes.   O:  VS: Blood pressure 122/61, pulse 84, temperature 98.3 F (36.8 C), temperature source Oral, resp. rate 18, height 5\' 3"  (1.6 m), weight 85.7 kg (189 lb), last menstrual period 06/19/2016, unknown if currently breastfeeding.        FHR : Intermittent Monitoring: FHR: 120 bpm/ + acceleration heard during contraction/ no decelerations auscultated before, during or after contraction         Toco: contractions every 2-4 minutes / moderate        Cervix : Dilation: 7.5 Effacement (%): 80 Station: -1 Presentation: Vertex Exam by:: Sharyl NimrodMeredith Shailah Gibbins CNM         Membranes: SROM - forebag intact        P/C Ratio: 0.33, all other labs WNL  A: Active labor     FHR category 1     GBS Positive - on PCN   P: Discussed if continued elevated BPs, will have her get out of the water and pt. Agrees.       Continuous labor support from CNM, partner and friend      Hydrotherapy for pain relief    Anticipate NSVD  Dr. Billy Coastaavon updated with plan of care/labs/agrees with plan    Carlean JewsMeredith Briya Lookabaugh, MSN, CNM [facility]

## 2017-06-01 NOTE — Anesthesia Pain Management Evaluation Note (Signed)
  CRNA Pain Management Visit Note  Patient: Tammy Holmes, 26 y.o., female  "Hello I am a [facility] of the anesthesia team at [facility]. We have an anesthesia team available at all times to provide care throughout the hospital, including epidural management and anesthesia for C-section. I don't know your plan for the delivery whether it a natural birth, water birth, IV sedation, nitrous supplementation, doula or epidural, but we want to meet your pain goals."   1.Was your pain managed to your expectations on prior hospitalizations?   No prior hospitalizations  2.What is your expectation for pain management during this hospitalization?     Water tub  3.How can we help you reach that goal? Be available  Record the patient's initial score and the patient's pain goal.   Pain: 5  Pain Goal: 5 [facility] wants you to be able to say your pain was always managed very well.  [facility] CenterMERRITT,Chudney Scheffler 06/01/2017

## 2017-06-01 NOTE — H&P (Signed)
  OB ADMISSION/ HISTORY & PHYSICAL:  Admission Date: 06/01/2017  3:39 PM  Admit Diagnosis: 39 weeks SROM, GBS positive   Tammy Holmes is a 26 y.o. female G1P0 at 4239 weeks presenting from office with SROM today at 12:30pm for clear fluid.  Prenatal History: G1P0   EDC : 06/08/17 [facility]; prior CP at [facility]  Primary Ob Provider: CNM management/ M. Gavina Dildine, CNM  Prenatal course complicated by  - GBS Positive - Isolated elevated BP without evidence of gestational hypertension  - Dependent edema - GBS UTI in pregnancy  Prenatal Labs: ABO, Rh: O/Positive/-- (06/28 0000) Antibody: Negative (06/28 0000) Rubella: Immune (06/28 0000)  RPR: Nonreactive (06/28 0000)  HBsAg: Negative (06/28 0000)  HIV: Non-reactive (06/28 0000)  GTT: 97 GBS: Positive (11/09 0000)  Declined genetic screening CUS normal anatomy Growth sono on 11/19: EFW 86%  Medical / Surgical History :  Past medical history:  Past Medical History:  Diagnosis Date  . Medical history non-contributory      Past surgical history:  Past Surgical History:  Procedure Laterality Date  . NO PAST SURGERIES      Family History: No family history on file.   Social History:  reports that  has never smoked. she has never used smokeless tobacco. She reports that she does not drink alcohol or use drugs.   Allergies: Patient has no known allergies.    Current Medications at time of admission:  Prior to Admission medications   Medication Sig Start Date End Date Taking? Authorizing Provider  Prenatal Vit-Fe Fumarate-FA (PRENATAL MULTIVITAMIN) TABS tablet Take 1 tablet by mouth daily at 12 noon.    [provider]     Review of Systems: Active FM onset of ctx @ 12:30pm currently every 3-5 minutes SROM at 12:30pm for clear fluid bloody show present  Physical Exam:  VS: Blood pressure (!) 140/96, pulse 74, temperature 98.9 F (37.2 C), temperature source  Oral, resp. rate 18, height 5\' 3"  (1.6 m), weight 85.7 kg (189 lb), last menstrual period 06/19/2016, unknown if currently breastfeeding.  General: alert and oriented, appears anxious  Heart: RRR Lungs: Clear lung fields Abdomen: Gravid, soft and non-tender, non-distended / uterus: gravid, non-tender Extremities: +3 bilateral pitting LE edema  Genitalia / VE:  5cm/80%/-1 in office   FHR: baseline rate 120 bpm/ variability moderate / accelerations + 15x15 / no decelerations TOCO: every 3-5 minutes/ moderate to palpation   Assessment: [redacted] weeks gestation Active stage of labor FHR category 1 GBS Positive   Plan:  [facility]   - Routine labor and delivery orders   - Pain management: hydrotherapy   - Planning water birth: sign consent form  2. GBS Positive    - PCN prophylaxis 3. Isolated elevated BP on admission    - PIH labs sent     - Continue to monitor BP 3. Postpartum:   - Breast   - Circumcision: undecided    - Contraception: unsure 4. Anticipate MOD: NSVD/WB    - EFW 8#   Dr. Billy Coastaavon notified of admission / plan of care  Carlean JewsMeredith Pryor Guettler, MSN, [facility]

## 2017-06-01 NOTE — Progress Notes (Signed)
S:  2 doses of PCN infusing. Pt. Has been out of the tub since 1930 to take a break. Has been using nitrous oxide PRN.  Discuss AROM of forebag, and pt. Agrees.  Denies headache, RUQ, states she saw a few spots when inhaling in the nitrous, but none since.   O:  VS: Blood pressure 123/72, pulse 87, temperature 98.6 F (37 C), temperature source Oral, resp. rate 18, height 5\' 3"  (1.6 m), weight 85.7 kg (189 lb), last menstrual period 06/19/2016, SpO2 100 %, unknown if currently breastfeeding.        FHR : baseline 120 bpm / variability moderate / accelerations + 15x15 accels / rare variable deceleration         Toco: contractions every 1-4 minutes / strong         Cervix : Dilation: 8 Effacement (%): 90 Station: 0, -1 Presentation: Vertex Exam by:: Carle Dargan, CNM        Membranes: AROM of forebag for clear fluid  A: Protracted active labor     FHR category 1  P: BPs stable, okay to get back in tub      Continuous labor support from partner, mother, and CNM     Anticipate NSVD    Carlean JewsMeredith Siyona Coto, MSN, CNM [facility]

## 2017-06-02 ENCOUNTER — Encounter (HOSPITAL_COMMUNITY): Payer: Self-pay

## 2017-06-02 DIAGNOSIS — Z8759 Personal history of other complications of pregnancy, childbirth and the puerperium: Secondary | ICD-10-CM

## 2017-06-02 LAB — COMPREHENSIVE METABOLIC PANEL
ALBUMIN: 2.4 g/dL — AB (ref 3.5–5.0)
ALK PHOS: 103 U/L (ref 38–126)
ALT: 11 U/L — AB (ref 14–54)
ANION GAP: 9 (ref 5–15)
AST: 38 U/L (ref 15–41)
BILIRUBIN TOTAL: 0.5 mg/dL (ref 0.3–1.2)
BUN: 15 mg/dL (ref 6–20)
CHLORIDE: 104 mmol/L (ref 101–111)
CO2: 18 mmol/L — AB (ref 22–32)
Calcium: 8.7 mg/dL — ABNORMAL LOW (ref 8.9–10.3)
Creatinine, Ser: 0.91 mg/dL (ref 0.44–1.00)
GFR calc Af Amer: >60 mL/min (ref >60)
GFR calc non Af Amer: >60 mL/min (ref >60)
GLUCOSE: 161 mg/dL — AB (ref 65–99)
POTASSIUM: 4 mmol/L (ref 3.5–5.1)
SODIUM: 131 mmol/L — AB (ref 135–145)
TOTAL PROTEIN: 4.8 g/dL — AB (ref 6.5–8.1)

## 2017-06-02 LAB — CBC
HEMATOCRIT: 34.7 % — AB (ref 36.0–46.0)
HEMOGLOBIN: 12.1 g/dL (ref 12.0–15.0)
MCH: 34.2 pg — AB (ref 26.0–34.0)
MCHC: 34.9 g/dL (ref 30.0–36.0)
MCV: 98 fL (ref 78.0–100.0)
Platelets: 219 10*3/uL (ref 150–400)
RBC: 3.54 MIL/uL — ABNORMAL LOW (ref 3.87–5.11)
RDW: 13.5 % (ref 11.5–15.5)
WBC: 18.6 10*3/uL — ABNORMAL HIGH (ref 4.0–10.5)

## 2017-06-02 LAB — RPR: RPR: NONREACTIVE

## 2017-06-02 MED ORDER — DIBUCAINE 1 % RE OINT
1.0000 "application " | TOPICAL_OINTMENT | RECTAL | Status: DC | PRN
  Administered 2017-06-02: 1 via RECTAL
  Filled 2017-06-02: qty 28

## 2017-06-02 MED ORDER — SENNOSIDES-DOCUSATE SODIUM 8.6-50 MG PO TABS
2.0000 | ORAL_TABLET | ORAL | Status: DC
  Administered 2017-06-03 (×2): 2 via ORAL
  Filled 2017-06-02 (×2): qty 2

## 2017-06-02 MED ORDER — COCONUT OIL OIL
1.0000 "application " | TOPICAL_OIL | Status: DC | PRN
  Administered 2017-06-03: 1 via TOPICAL
  Filled 2017-06-02: qty 120

## 2017-06-02 MED ORDER — HYDRALAZINE HCL 20 MG/ML IJ SOLN: 10.0000 mg | Freq: Once | INTRAMUSCULAR | Status: DC | PRN

## 2017-06-02 MED ORDER — ONDANSETRON HCL 4 MG/2ML IJ SOLN: 4.0000 mg | INTRAMUSCULAR | Status: DC | PRN

## 2017-06-02 MED ORDER — ZOLPIDEM TARTRATE 5 MG PO TABS: 5.0000 mg | ORAL_TABLET | Freq: Every evening | ORAL | Status: DC | PRN

## 2017-06-02 MED ORDER — METHYLERGONOVINE MALEATE 0.2 MG/ML IJ SOLN: 0.2000 mg | INTRAMUSCULAR | Status: DC | PRN

## 2017-06-02 MED ORDER — BENZOCAINE-MENTHOL 20-0.5 % EX AERO
1.0000 "application " | INHALATION_SPRAY | CUTANEOUS | Status: DC | PRN
  Administered 2017-06-02: 1 via TOPICAL
  Filled 2017-06-02: qty 56

## 2017-06-02 MED ORDER — TETANUS-DIPHTH-ACELL PERTUSSIS 5-2.5-18.5 LF-MCG/0.5 IM SUSP: 0.5000 mL | Freq: Once | INTRAMUSCULAR | Status: DC

## 2017-06-02 MED ORDER — ONDANSETRON HCL 4 MG PO TABS: 4.0000 mg | ORAL_TABLET | ORAL | Status: DC | PRN

## 2017-06-02 MED ORDER — DIPHENHYDRAMINE HCL 25 MG PO CAPS
25.0000 mg | ORAL_CAPSULE | Freq: Four times a day (QID) | ORAL | Status: DC | PRN
Start: 2017-06-02 — End: 2017-06-04

## 2017-06-02 MED ORDER — ACETAMINOPHEN 325 MG PO TABS: 650.0000 mg | ORAL_TABLET | ORAL | Status: DC | PRN

## 2017-06-02 MED ORDER — OXYCODONE-ACETAMINOPHEN 5-325 MG PO TABS: 2.0000 | ORAL_TABLET | ORAL | Status: DC | PRN

## 2017-06-02 MED ORDER — PRENATAL MULTIVITAMIN CH
1.0000 | ORAL_TABLET | Freq: Every day | ORAL | Status: DC
  Administered 2017-06-02 – 2017-06-03 (×2): 1 via ORAL
  Filled 2017-06-02 (×2): qty 1

## 2017-06-02 MED ORDER — SIMETHICONE 80 MG PO CHEW: 80.0000 mg | CHEWABLE_TABLET | ORAL | Status: DC | PRN

## 2017-06-02 MED ORDER — METHYLERGONOVINE MALEATE 0.2 MG PO TABS: 0.2000 mg | ORAL_TABLET | ORAL | Status: DC | PRN

## 2017-06-02 MED ORDER — OXYCODONE-ACETAMINOPHEN 5-325 MG PO TABS: 1.0000 | ORAL_TABLET | ORAL | Status: DC | PRN

## 2017-06-02 MED ORDER — WITCH HAZEL-GLYCERIN EX PADS
1.0000 "application " | MEDICATED_PAD | CUTANEOUS | Status: DC | PRN
  Administered 2017-06-02: 1 via TOPICAL

## 2017-06-02 MED ORDER — LABETALOL HCL 5 MG/ML IV SOLN: 20.0000 mg | INTRAVENOUS | Status: DC | PRN

## 2017-06-02 MED ORDER — IBUPROFEN 600 MG PO TABS
600.0000 mg | ORAL_TABLET | Freq: Four times a day (QID) | ORAL | Status: DC
  Administered 2017-06-02 – 2017-06-04 (×9): 600 mg via ORAL
  Filled 2017-06-02 (×9): qty 1

## 2017-06-02 NOTE — Progress Notes (Addendum)
Pt was tricking at her 1hrs check. Pt had not voided in L&D, so I helped her to the bathroom. While on the toilet pt felt weak, dizzy and light headed. Pt passed some clots that were the size of a small tangerine. Got pt back to the bed with the steady and checked her fundus. Pt fundus is firm, 1below with scant bleeding. Blood pressure 142/86, HR 116 with EBL of 421.  Dr. Idelle JoSigmon is aware and will continue to monitor.

## 2017-06-02 NOTE — Lactation Note (Signed)
This note was copied from a baby's chart. Lactation Consultation Note  Patient Name: Tammy Marily Memosaylor Asmar OZHYQ'MToday's Date: 06/02/2017 Reason for consult: Initial assessment;Difficult latch  Baby 12 hours old. Mom reports that baby out of the room for hearing screen and then will have circumcision. Mom states that baby is latching better to right breast than left. Mom's right nipple is slightly more everted. Assisted mom with hand expression and mom easily expressible with colostrum flowing bilaterally. Discussed positioning and how to support baby's head and her breast while latching baby. Discussed how to use NS, and the need for additional stimulation while using NS. Enc mom to use manual pump for now, but to ask her nurse to be set up with DEBP later today if she continues to need the NS. Discussed how to use NS initially with a latch, and then attempt to latch directly to breast. Discussed infant behavior after circumcision, and enc mom to call for assistance as needed. [facility], aware of OP/BFSG and LC phone line assistance after D/C.   Maternal Data Has patient been taught Hand Expression?: Yes Does the patient have breastfeeding experience prior to this delivery?: No  Feeding Feeding Type: Breast Fed Length of feed: 10 min  LATCH Score                   Interventions Interventions: Breast feeding basics reviewed;Hand express  Lactation Tools Discussed/Used Tools: Nipple Shields Nipple shield size: 24   Consult Status Consult Status: Follow-up Date: 06/03/17 Follow-up type: In-patient    Sherlyn HayJennifer D Suraiya Dickerson 06/02/2017, 12:35 PM

## 2017-06-02 NOTE — Progress Notes (Addendum)
PPD # 0 interval note, VAVD with median episiotomy repair, baby boy   S:  Reports feeling very sore and tired  Had one episode of dizziness at 4:30am, but has been using assistance since and has not felt dizzy again.   Using ice packs, witch hazel pads, Dibucaine, Dermoplast PRN with some relief              Tolerating po/ No nausea or vomiting / Denies dizziness or SOB             Bleeding is moderate             Pain controlled withMotrin and Tylenol             Up ad lib / ambulatory / voiding QS  Newborn breast feeding - working on latching / Circumcision - planning today or tomorrow   O:               VS: BP 137/84 (BP Location: Right Arm)   Pulse (!) 110   Temp 98.2 F (36.8 C) (Oral)   Resp 18   Ht 5\' 3"  (1.6 m)   Wt 85.7 kg (189 lb)   LMP 06/19/2016   SpO2 100%   Breastfeeding? Unknown   BMI 33.48 kg/m    LABS:              Recent Labs    06/01/17 1630 06/02/17 0540  WBC 9.4 18.6*  HGB 14.3 12.1  PLT 212 219               Blood type: --/--/O POS (12/03 1630)  Rubella: Immune, Immune (06/28 0000)                     I&O: Intake/Output      12/03 0701 - 12/04 0700 12/04 0701 - 12/05 0700   Urine (mL/kg/hr) 0    Blood 921    Total Output 921    Net -921         Urine Occurrence 1 x                  Physical Exam:             Alert and oriented X3  Lungs: Clear and unlabored  Heart: regular rate and rhythm / no murmurs  Abdomen: soft, non-tender, non-distended              Fundus: firm, non-tender, U-1  Perineum: well approximated median episiotomy, perineal edema noted, large hemorrhoids noted, slight ecchymosis noted - possible small hematoma at sight of hemorrhoid, non-tender on palpation, minimal erythema  Lochia: appropriate, no clots   Extremities: +3 BLE edema, no calf pain or tenderness    A: PPD # 0, VAVD  Median episiotomy  Dependent edema   Doing well - stable status  P: Routine post partum orders  Recommend Nettle's and Dandelion tea -  offered low dose HCTZ for BPs and dependent edema, and she declines at this time   Normal labs this morning   TED hose bilaterally   See lactation today   Recommend alternating ice packs and sitz baths PRN  Anticipate discharge home Thursday  Carlean JewsMeredith Sigmon, MSN, CNM [facility]

## 2017-06-03 MED ORDER — HYDROCORTISONE ACE-PRAMOXINE 1-1 % RE FOAM
1.0000 | Freq: Three times a day (TID) | RECTAL | Status: DC
  Administered 2017-06-03 (×2): 1 via RECTAL
  Filled 2017-06-03: qty 10

## 2017-06-03 MED ORDER — HYDROCORTISONE ACE-PRAMOXINE 1-1 % RE CREA
TOPICAL_CREAM | Freq: Three times a day (TID) | RECTAL | Status: DC
  Filled 2017-06-03: qty 30

## 2017-06-03 NOTE — Progress Notes (Signed)
PPD # 1 SVD Information for the patient's newborn:  Lanice, Folden [[id number]]  female      breast feeding  / Circumcision completed Baby name: undecided  S:  Reports feeling sore perineum and rectum, pain from hemorrhoids, also notes hard knot on lower L labia             Tolerating po/ No nausea or vomiting             Bleeding is decreased             Pain controlled with PO meds             Up ad lib / ambulatory / voiding without difficulties        O:  A & O x 3, in no apparent distress              VS:  Vitals:   06/02/17 0835 06/02/17 1621 06/02/17 1815 06/03/17 0527  BP: 137/84 131/71 124/71 123/60  Pulse: (!) 110 (!) 108 98 77  Resp:  16 18 16   Temp:  98.3 F (36.8 C) 98.7 F (37.1 C) 98 F (36.7 C)  TempSrc:  Axillary Oral Oral  SpO2:  100%    Weight:      Height:        LABS:  Recent Labs    06/01/17 1630 06/02/17 0540  WBC 9.4 18.6*  HGB 14.3 12.1  HCT 41.8 34.7*  PLT 212 219   Hepatic Function Latest Ref Rng & Units 06/02/2017 06/01/2017  Total Protein 6.5 - 8.1 g/dL 4.8(L) 6.2(L)  Albumin 3.5 - 5.0 g/dL 2.4(L) 3.0(L)  AST 15 - 41 U/L 38 22  ALT 14 - 54 U/L 11(L) 11(L)  Alk Phosphatase 38 - 126 U/L 103 141(H)  Total Bilirubin 0.3 - 1.2 mg/dL 0.5 0.6     Blood type: --/--/O POS (12/03 1630)  Rubella: Immune, Immune (06/28 0000)   I&O: I/O last 3 completed shifts: In: -  Out: 921 [Blood:921]          No intake/output data recorded.  Lungs: Clear and unlabored  Heart: regular rate and rhythm / no murmurs  Abdomen: soft, non-tender, non-distended             Fundus: firm, non-tender, U-1  Perineum: moderate edema, +echymoses, labia L>R, small hematoma palp tender   + hemorrhoid 1 in inflamed, no thrombosis  Lochia: small  Extremities: +2 pedal edema, no calf pain or tenderness    A/P: PPD # 1 26 y.o., G2P1001   Principal Problem:   Postpartum care following VAVD (12/4) Active Problems:   Normal labor   Status post vacuum-assisted  vaginal delivery: indication maternal exhaustion, fetal heart rate indication    Obstetrical laceration: median episitomy  Vulval hematoma - mild Hemorrhoid inflammation Elevated BP in labor - normotensive since delivery, no PEC s/s, labs stable   Doing well - stable status  Routine post partum orders  Breastfeeding support - LS consult for latch  Bowel regimen, Anal pram TID, stool softener, sitz baths  Nettle and dandelion tes 2-3 cups daily and add arnica pellets for tissue bruising  Anticipate discharge tomorrow  F/U in office 2 and 6 wks PP  Juliene Pina, MSN, CNM 06/03/2017, 8:56 AM

## 2017-06-03 NOTE — Lactation Note (Addendum)
This note was copied from a baby's chart. Lactation Consultation Note Baby 26 hrs old. Cluster feeding. Requested to rm. D/t baby fussy , mom having trouble latching. When LC entered rm. Mom had baby latched to Lt. Breast w/#24 NS. RN had given to mom d/t stated nipples where flat. Lt. Nipple a slightly short shaft at rest. Mom has cone shaped breast w/bulbous areolas and nipple. Hand expression w/colostrum noted. Breast feel slightly heavy. Approx. 2 fingers width space between breast. Discussed proper body alignment, baby was twisted slightly. added another pillow to bring baby closer to breast. Mom stated much better. Heard occasional swallow while feeding on NS. When baby finished feeding NS removed, NS dry on inside. Nipple erect. Baby awakens latch onto nipple. Nipples sore, skin intact. Mom assessed breast before BF w/NS and afterwards. No change noted. Baby BF great w/o NS, heard multiple swallows. Noted significant softening in breast after feeding. Baby sleeping soundly.  Gave mom shells to wear, encouraged hand pump to evert nipple prior to latching if not erect well. Encouraged to finger stimulate. Nipples very compressible. Encouraged football hold for next several feedings. Taught chin tug. Mom requested to assess her personal DEBP for flange fitting. Mom has Even flow. Flange may be snug after pumping, but has room before pumping. Instructed mom if pumping hurts or rubs, then to tight. #27 flange given to use if needed for hand pump. Mom has med/lg. Nipple. Discussed milk transfer, filling, mature milk, milk storage, engorgement management, breast massage, and importance of obtaining deep latch. answered a lot of questions mom had. Reported to RN of consult. Patient Name: Tammy Holmes GNFAO'ZToday's Date: 06/03/2017 Reason for consult: Follow-up assessment;Mother's request;Difficult latch   Maternal Data    Feeding Feeding Type: Breast Fed Length of feed: 60 min  LATCH Score Latch:  Repeated attempts needed to sustain latch, nipple held in mouth throughout feeding, stimulation needed to elicit sucking reflex.  Audible Swallowing: A few with stimulation  Type of Nipple: Everted at rest and after stimulation  Comfort (Breast/Nipple): Filling, red/small blisters or bruises, mild/mod discomfort  Hold (Positioning): Assistance needed to correctly position infant at breast and maintain latch.  LATCH Score: 6  Interventions Interventions: Breast feeding basics reviewed;Breast compression;Assisted with latch;Adjust position;Hand pump;Skin to skin;Support pillows;Breast massage;Position options;Hand express;Pre-pump if needed;Shells  Lactation Tools Discussed/Used Tools: Shells;Pump;Flanges;Nipple Shields Nipple shield size: 24 Flange Size: 27 Shell Type: Inverted Breast pump type: Manual Pump Review: Setup, frequency, and cleaning;Milk Storage Initiated by:: RN Date initiated:: 06/02/17   Consult Status Consult Status: Follow-up Date: 06/03/17(in pm) Follow-up type: In-patient    Charyl DancerCARVER, Darlisa Spruiell G 06/03/2017, 2:38 AM

## 2017-06-04 MED ORDER — HYDROCORTISONE ACE-PRAMOXINE 1-1 % RE FOAM: 1.0000 | Freq: Three times a day (TID) | RECTAL | 0 refills | Status: DC

## 2017-06-04 MED ORDER — IBUPROFEN 600 MG PO TABS: 600.0000 mg | ORAL_TABLET | Freq: Four times a day (QID) | ORAL | 0 refills | Status: DC

## 2017-06-04 MED ORDER — SENNOSIDES-DOCUSATE SODIUM 8.6-50 MG PO TABS: 2.0000 | ORAL_TABLET | ORAL | 3 refills | Status: DC

## 2017-06-04 NOTE — Lactation Note (Signed)
This note was copied from a baby's chart. Lactation Consultation Note Baby 55 hrs old had 9% weight loss. 5 voids, 6 stools. Baby screaming, aggressive at breast. Drop of colostrum in Ns w/bld tinge. Comfort gels given, DEBP started, mom pumped w/drops, after hand expressed 3 ml colostrum. Baby screaming, FOB put glove on let baby suckle on finger to calm down, LC spoon fed colostrum. Baby resting. Demonstrated 5 FR supplement feeding of Similac in NS. Instructed parents for next feeding use 5 Fr. W/supplement.  Encouraged mom to wear bra w/shells. Laid mom down applied ice to aide edema. Lt. Areola and nipple has edema. Reverse pressure not helpful.  Report to RN, and LC. Baby noted to have probable tight frenulum as well as FOB. LC doesn't feel that baby is getting transfer from breast even w/NS. Could be d/t edema? Patient Name: Tammy Holmes WUJWJ'XToday's Date: 06/04/2017 Reason for consult: Follow-up assessment;Mother's request;Difficult latch;Nipple pain/trauma   Maternal Data    Feeding Feeding Type: Breast Milk Length of feed: 15 min  LATCH Score Latch: Grasps breast easily, tongue down, lips flanged, rhythmical sucking.  Audible Swallowing: A few with stimulation  Type of Nipple: Everted at rest and after stimulation  Comfort (Breast/Nipple): Engorged, cracked, bleeding, large blisters, severe discomfort  Hold (Positioning): Full assist, staff holds infant at breast  LATCH Score: 5  Interventions Interventions: Comfort gels;Assisted with latch;Adjust position;Breast compression;Breast feeding basics reviewed;Skin to skin;Support pillows;DEBP;Breast massage;Position options;Ice;Hand express;Expressed milk;Shells;Reverse pressure  Lactation Tools Discussed/Used Tools: Shells;Pump;Comfort gels;Nipple Shields Nipple shield size: 24 Shell Type: Inverted Breast pump type: Double-Electric Breast Pump Pump Review: Setup, frequency, and cleaning;Milk Storage Initiated by:: Peri JeffersonL.  Solmon Bohr RN IBCLC Date initiated:: 06/04/17   Consult Status Consult Status: Follow-up Date: 06/04/17 Follow-up type: In-patient    Charyl DancerCARVER, [name] 06/04/2017, 7:23 AM

## 2017-06-04 NOTE — Discharge Summary (Signed)
Obstetric Discharge Summary   Patient Name: Tammy Holmes DOB: August 30, 1990 MRN: [id number]  Date of Admission: 06/01/2017 Date of Discharge: 06/04/2017 Date of Delivery: 06/02/17 Gestational Age at Delivery: 3854w1d  Primary OB: [facility] - Carlean JewsMeredith Kao Conry, CNM   Antepartum complications:  GBS Positive - Isolated elevated BP without evidence of gestational hypertension  - Dependent edema - GBS UTI in pregnancy  Prenatal Labs:  ABO, Rh: O/Positive/-- (06/28 0000) Antibody: Negative (06/28 0000) Rubella: Immune (06/28 0000)  RPR: Nonreactive (06/28 0000)  HBsAg: Negative (06/28 0000)  HIV: Non-reactive (06/28 0000)  GTT: 97 GBS: Positive (11/09 0000)  Declined genetic screening CUS normal anatomy Growth sono on 11/19: EFW 86%  Admitting Diagnosis: SROM at term   Secondary Diagnoses: Patient Active Problem List   Diagnosis Date Noted  . Status post vacuum-assisted vaginal delivery: indication maternal exhaustion, fetal heart rate indication  06/02/2017  . Postpartum care following VAVD (12/4) 06/02/2017  . Obstetrical laceration: median episitomy  06/02/2017  . Normal labor 06/01/2017    Augmentation: AROM of forebag after PCN abx  Date of Delivery: 06/02/17 Delivered By: Dr. Manuela Neptuneaavon/M. Elya Diloreto, CNM  Delivery Type: Vacuum assisted vaginal delivery  Anesthesia: hydrotherapy  Placenta: sponatneous Laceration: 2nd degree with vaginal extension  Episiotomy: median  Newborn Data: Live born female  Birth Weight: 7 lb 9 oz (3430 g) APGAR: 8, 9  Newborn Delivery   Birth date/time:  06/02/2017 00:02:00 Delivery type:  Vaginal, Vacuum [facility](Ext[facility])     Hospital Course  (Vaginal Delivery): Pt. Admitted from the office with SROM, 5cm and GBS positive.  She had mildly elevated BPs during labor with p/c ratio of 0.33, but labs stable and BPs normalized after delivery. She received 2 doses of PCN, and I ruptured forebag.  She used hydrotherapy for pain relief.  She pushed for  2 hours and became exhausted and there were fetal heart variables to nadir of 60-90 bpm.  Dr. Billy Coastaavon called to assess for VAVD.  He performed a VAVD and median episiotomy.  She developed a mild vulvar hematoma, but planning for expectant management.  By time of discharge on PPD#2, her pain was controlled on oral pain medications; she had appropriate lochia and was ambulating, voiding without difficulty and tolerating regular diet.  She was deemed stable for discharge to home.     Labs: CBC Latest Ref Rng & Units 06/02/2017 06/01/2017 07/30/2016  WBC 4.0 - 10.5 K/uL 18.6(H) 9.4 6.5  Hemoglobin 12.0 - 15.0 g/dL 13.212.1 44.014.3 10.214.1  Hematocrit 36.0 - 46.0 % 34.7(L) 41.8 40.0  Platelets 150 - 400 K/uL 219 212 194   O POS  Physical exam:  BP 119/72 (BP Location: Right Arm)   Pulse 73   Temp 98.5 F (36.9 C) (Oral)   Resp 16   Ht 5\' 3"  (1.6 m)   Wt 85.7 kg (189 lb)   LMP 06/19/2016   SpO2 99%   Breastfeeding? Unknown   BMI 33.48 kg/m  General: alert and no distress Pulm: normal respiratory effort Lochia: appropriate Abdomen: soft, NT Uterine Fundus: firm, below umbilicus Perineum: moderate edema, +echymoses, labia L>R, small hematoma palp tender + hemorrhoid 1 in inflamed, no thrombosis Extremities: No evidence of DVT seen on physical exam. No lower extremity edema.  Disposition: stable, discharge to home Baby Feeding: breast milk and formula Baby Disposition: home with mom  Contraception: unsure  Rh Immune globulin given: N/A Rubella vaccine given: N/A Tdap vaccine given in AP or PP setting: UTD Flu vaccine given in AP  or PP setting: not on file   Plan:  Tammy Memosaylor Pfluger was discharged to home in good condition. [facility].  Discharge Instructions: Per After Visit Summary. Activity: Advance as tolerated. Pelvic rest for 6 weeks.  Refer to After Visit Summary Diet: Regular, Heart Healthy Discharge Medications: Allergies as of 06/04/2017    No Known Allergies     Medication List    TAKE these medications   hydrocortisone-pramoxine rectal foam Commonly known as:  PROCTOFOAM-HC Place 1 applicator rectally 3 (three) times daily.   ibuprofen 600 MG tablet Commonly known as:  ADVIL,MOTRIN Take 1 tablet (600 mg total) by mouth every 6 (six) hours.   prenatal multivitamin Tabs tablet Take 1 tablet by mouth daily at 12 noon.   senna-docusate 8.6-50 MG tablet Commonly known as:  Senokot-S Take 2 tablets by mouth daily. Start taking on:  06/05/2017      Outpatient follow up:  Follow-up Information    Karena AddisonSigmon, Jeremyah Jelley C, CNM. Schedule an appointment as soon as possible for a visit in 2 week(s).   Specialty:  Obstetrics and Gynecology Why:  Interval postpartum visit, then visit at 6 weeks Contact information: [address] [place] [address] [phone]           Signed:  Carlean JewsMeredith Masiyah Engen, MSN, CNM [facility]

## 2017-06-04 NOTE — Progress Notes (Signed)
PPD # 2, VAVD with median episiotomy repair, baby boy   S:  Reports feeling better, but still sore             Tolerating po/ No nausea or vomiting / Denies dizziness or SOB             Bleeding is light             Pain controlled withMotrin             Up ad lib / ambulatory / voiding QS  Newborn breast feeding with formula supplementation - having some pain and difficulty with latching, has been working with lactation  / Circumcision - done  O:               VS: BP 119/72 (BP Location: Right Arm)   Pulse 73   Temp 98.5 F (36.9 C) (Oral)   Resp 16   Ht 5\' 3"  (1.6 m)   Wt 85.7 kg (189 lb)   LMP 06/19/2016   SpO2 99%   Breastfeeding? Unknown   BMI 33.48 kg/m    LABS:              Recent Labs    06/01/17 1630 06/02/17 0540  WBC 9.4 18.6*  HGB 14.3 12.1  PLT 212 219               Blood type: --/--/O POS (12/03 1630)  Rubella: Immune, Immune (06/28 0000)                     I&O: Intake/Output    None                 Physical Exam:             Alert and oriented X3  Lungs: Clear and unlabored  Heart: regular rate and rhythm / no murmurs  Abdomen: soft, non-tender, non-distended              Fundus: firm, non-tender, U-1  Perineum:  non-tender, U-1             Perineum: moderate edema, +echymoses, labia L>R, small hematoma palp tender + hemorrhoid 1 in inflamed, no thrombosis  Lochia: appropriate, no clots  Extremities: +2 pedal edema, no calf pain or tenderness    A: PPD # 2, VAVD             Median episiotomy             Dependent edema                  Vulval hematoma - mild             Hemorrhoid inflammation             Elevated BP in labor - normotensive since delivery, no PEC s/s, labs stable             Doing well - stable status  P: Routine post partum orders  Discharge home today   WOB and discharge instructions given   Encouraged sitz baths daily   Continue Proctofoam and stool softeners   F/u in 2 weeks for PP visit   Carlean JewsMeredith Sigmon, MSN,  CNM [facility]

## 2017-06-04 NOTE — Lactation Note (Signed)
This note was copied from a baby's chart. Lactation Consultation Note: Parents had baby latched to breast with NS and feeding tube/syringem as I went into room. Reports this is going well. Reviewed she can use breast milk in tube if available. Has Evenflo pump for home. Reviewed how to use pump pieces as DEBP at home. Has comfort gels and IN shells. Offered oP appointment- Ped has LC in office- encouraged to see them. No questions at present. To call prn  Patient Name: Tammy Holmes UUVOZ'DToday's Date: 06/04/2017 Reason for consult: Follow-up assessment   Maternal Data Formula Feeding for Exclusion: No Has patient been taught Hand Expression?: Yes Does the patient have breastfeeding experience prior to this delivery?: No  Feeding Feeding Type: Breast Fed Length of feed: 25 min  LATCH Score Latch: Grasps breast easily, tongue down, lips flanged, rhythmical sucking.  Audible Swallowing: A few with stimulation  Type of Nipple: Everted at rest and after stimulation  Comfort (Breast/Nipple): Soft / non-tender  Hold (Positioning): No assistance needed to correctly position infant at breast.  LATCH Score: 9  Interventions Interventions: Breast feeding basics reviewed;Hand express;Pre-pump if needed;Breast compression;Expressed milk;Coconut oil;Shells;Comfort gels  Lactation Tools Discussed/Used Tools: Shells;Pump;Flanges;Comfort gels;Nipple Shields;32F feeding tube / Syringe Nipple shield size: 24 Flange Size: 27 Shell Type: Inverted Breast pump type: Double-Electric Breast Pump WIC Program: No   Consult Status Consult Status: Complete    Pamelia HoitWeeks, Trevor Duty D 06/04/2017, 11:07 AM

## 2018-02-03 ENCOUNTER — Encounter: Payer: Self-pay | Admitting: Family Medicine

## 2018-02-03 ENCOUNTER — Ambulatory Visit (INDEPENDENT_AMBULATORY_CARE_PROVIDER_SITE_OTHER): Payer: No Typology Code available for payment source | Admitting: Family Medicine

## 2018-02-03 VITALS — BP 100/65 | HR 61 | Ht 64.0 in | Wt 146.6 lb

## 2018-02-03 DIAGNOSIS — N911 Secondary amenorrhea: Secondary | ICD-10-CM | POA: Insufficient documentation

## 2018-02-03 DIAGNOSIS — G5600 Carpal tunnel syndrome, unspecified upper limb: Secondary | ICD-10-CM

## 2018-02-03 DIAGNOSIS — Z8719 Personal history of other diseases of the digestive system: Secondary | ICD-10-CM | POA: Diagnosis not present

## 2018-02-03 DIAGNOSIS — M654 Radial styloid tenosynovitis [de Quervain]: Secondary | ICD-10-CM | POA: Insufficient documentation

## 2018-02-03 DIAGNOSIS — O26899 Other specified pregnancy related conditions, unspecified trimester: Secondary | ICD-10-CM | POA: Diagnosis not present

## 2018-02-03 NOTE — Patient Instructions (Signed)
Please ice for 15-20 minutes, several times per day as needed for pain.  You can do this every hour if tolerated.  Use your brace as we discussed at all times if possible.    Also if this is not resolved in the next 2 to 3 weeks, then please call us as we can send you to sports med for injections.    De Quervain Tenosynovitis Tendons attach muscles to bones. They also help with joint movements. When tendons become irritated or swollen, it is called tendinitis. The extensor pollicis brevis (EPB) tendon connects the EPB muscle to a bone that is near the base of the thumb. The EPB muscle helps to straighten and extend the thumb. De Quervain tenosynovitis is a condition in which the EPB tendon lining (sheath) becomes irritated, thickened, and swollen. This condition is sometimes called stenosing tenosynovitis. This condition causes pain on the thumb side of the back of the wrist. What are the causes? Causes of this condition include:  Activities that repeatedly cause your thumb and wrist to extend.  A sudden increase in activity or change in activity that affects your wrist.  What increases the risk? This condition is more likely to develop in:  Females.  People who have diabetes.  Women who have recently given birth.  People who are over 58 years of age.  People who do activities that involve repeated hand and wrist motions, such as tennis, racquetball, volleyball, gardening, and taking care of children.  People who do heavy labor.  People who have poor wrist strength and flexibility.  People who do not warm up properly before activities.  What are the signs or symptoms? Symptoms of this condition include:  Pain or tenderness over the thumb side of the back of the wrist when your thumb and wrist are not moving.  Pain that gets worse when you straighten your thumb or extend your thumb or wrist.  Pain when the injured area is touched.  Locking or catching of the thumb joint  while you bend and straighten your thumb.  Decreased thumb motion due to pain.  Swelling over the affected area.  How is this diagnosed? This condition is diagnosed with a medical history and physical exam. Your health care provider will ask for details about your injury and ask about your symptoms. How is this treated? Treatment may include the use of icing and medicines to reduce pain and swelling. You may also be advised to wear a splint or brace to limit your thumb and wrist motion. In less severe cases, treatment may also include working with a physical therapist to strengthen your wrist and calm the irritation around your EPB tendon sheath. In severe cases, surgery may be needed. Follow these instructions at home: If you have a splint or brace:  Wear it as told by your health care provider. Remove it only as told by your health care provider.  Loosen the splint or brace if your fingers become numb and tingle, or if they turn cold and blue.  Keep the splint or brace clean and dry. Managing pain, stiffness, and swelling  If directed, apply ice to the injured area. ? Put ice in a plastic bag. ? Place a towel between your skin and the bag. ? Leave the ice on for 20 minutes, 2-3 times per day.  Move your fingers often to avoid stiffness and to lessen swelling.  Raise (elevate) the injured area above the level of your heart while you are sitting or  lying down. General instructions  Return to your normal activities as told by your health care provider. Ask your health care provider what activities are safe for you.  Take over-the-counter and prescription medicines only as told by your health care provider.  Keep all follow-up visits as told by your health care provider. This is important.  Do not drive or operate heavy machinery while taking prescription pain medicine. Contact a health care provider if:  Your pain, tenderness, or swelling gets worse, even if you have had  treatment.  You have numbness or tingling in your wrist, hand, or fingers on the injured side. This information is not intended to replace advice given to you by your health care provider. Make sure you discuss any questions you have with your health care provider. Document Released: 06/16/2005 Document Revised: 11/22/2015 Document Reviewed: 08/22/2014 Elsevier Interactive Patient Education  [employer]2018 Elsevier Inc.

## 2018-02-03 NOTE — Progress Notes (Signed)
New patient office visit note:  Impression and Recommendations:    1. De Quervain's tenosynovitis, bilateral   2. Postpartum amenorrhea- no periods since child- 8 mo ago   3. Carpal tunnel syndrome during pregnancy   4. History of gastroesophageal reflux (GERD)     Lifestyle -Emphasized the importance of at least 30 min of moderate exercise 5 days a week per AHA guidelines  Carpal Tunnel during/after pregnancy -Encouraged patient to buy a thumb spica splints, and to use with ice and heat cycles for non-pharmaceutical management -Discussed importance of avoiding specific OTC pain medications while nursing -Encouraged patient to contact us if pain worsens; discussed referrals to sports medicine if symptoms increase  Medical Management -Encouraged patient to return for a physical and blood panel within 4 - 6 months     Education and routine counseling performed. Handouts provided.   Gross side effects, risk and benefits, and alternatives of medications discussed with patient.  Patient is aware that all medications have potential side effects and we are unable to predict every side effect or drug-drug interaction that may occur.  Expresses verbal understanding and consents to current therapy plan and treatment regimen.  Return for Follow-up near future for complete physical, come fasting.  Please see AVS handed out to patient at the end of our visit for further patient instructions/ counseling done pertaining to today's office visit.    Note: This document was prepared using Dragon voice recognition software and may include unintentional dictation errors.     This document serves as a record of services personally performed by Thomasene Loteborah Jeraldean Wechter, MD. It was created on her behalf by Alphonse GuildLauren Pearson, a trained medical scribe. The creation of this record is based on the scribe's personal observations and the provider's statements to them.   I have reviewed the above medical  documentation for accuracy and completeness and I concur.  [name] 03/21/18 5:05 PM    ----------------------------------------------------------------------------------------------------------------------    Subjective:    Chief complaint:   Chief Complaint  Patient presents with  . Establish Care     HPI: Tammy Holmes is a pleasant 27 y.o. female who [facility] today to review their medical history with me and establish care.   I asked the patient to review their chronic problem list with me to ensure everything was updated and accurate.    All recent office visits with other providers, any medical records that patient brought in etc  - I reviewed today.     We asked pt to get us their medical records from [facility] providers/ specialists that they had seen within the past 3-5 years- if they are in private practice and/or do not work for [facility], [facility], [facility], [facility] or [facility] owned practice.  Told them to call their specialists to clarify this if they are not sure.   Lifestyle -Patient is married and had first child 8 months ago -Stays at home with child, has a degree in psychology -Family is roughly 2 hours away -[place] in [place] years and needed to get a PCP -Patient states preventing chronic disease with diet and exercise is very important to her  -Patient exercises occasionally and enjoys yoga -States her and her husband want to work towards exercising at least 15 minutes a day  -Patient is currently breastfeeding  Social History -Mother smoked throughout childhood -Patient has never smoked -Patient drinks 1-3 drinks per  week socially  Family History -Mother diagnosed with diabetes in late 82's -Mother has had high blood pressure since preeclampsia during pregnancy with her -Heart Attack: Mother, 2 great aunts -HLD: maternal great grandmother, mother, maternal grandmother -Stroke:  maternal grandmother, maternal great grandmother  -No surgical history  Childbirth  -Child born roughly 8 months ago -States she had acid reflux only during pregnancy -Patient had carpal tunnel during pregnancy and still has some wrist issues now -Most of pain is in wrist, has intensified since birth of child -Has not had a return of menstrual cycle since childbirth -Is nursing currently   Wt Readings from Last 3 Encounters:  02/03/18 146 lb 9.6 oz (66.5 kg)  06/01/17 189 lb (85.7 kg)   BP Readings from Last 3 Encounters:  02/03/18 100/65  06/04/17 119/72  08/07/16 104/66   Pulse Readings from Last 3 Encounters:  02/03/18 61  06/04/17 73  08/07/16 64   BMI Readings from Last 3 Encounters:  02/03/18 25.16 kg/m  06/01/17 33.48 kg/m    Patient Care Team    Relationship Specialty Notifications Start End  Thomasene Lot, DO PCP - General Family Medicine  02/03/18   Karena Addison, CNM Midwife Obstetrics and Gynecology  02/03/18     Patient Active Problem List   Diagnosis Date Noted  . De Quervain's tenosynovitis, bilateral 02/03/2018  . Postpartum amenorrhea- no periods since child- 8 mo ago 02/03/2018  . Carpal tunnel syndrome during pregnancy 02/03/2018  . History of gastroesophageal reflux (GERD) 02/03/2018  . Status post vacuum-assisted vaginal delivery: indication maternal exhaustion, fetal heart rate indication  06/02/2017  . Postpartum care following VAVD (12/4) 06/02/2017  . Obstetrical laceration: median episitomy  06/02/2017  . Normal labor 06/01/2017     Past Medical History:  Diagnosis Date  . Medical history non-contributory      Past Medical History:  Diagnosis Date  . Medical history non-contributory      Past Surgical History:  Procedure Laterality Date  . NO PAST SURGERIES       Family History  Problem Relation Age of Onset  . Diabetes Mother   . Hyperlipidemia Mother   . Hypertension Mother   . Hyperlipidemia Maternal  Grandmother   . Hypertension Maternal Grandmother   . Stroke Maternal Grandmother      Social History   Substance and Sexual Activity  Drug Use No     Social History   Substance and Sexual Activity  Alcohol Use No     Social History   Tobacco Use  Smoking Status Never Smoker  Smokeless Tobacco Never Used     Current Meds  Medication Sig  . Prenatal Vit-Fe Fumarate-FA (PRENATAL MULTIVITAMIN) TABS tablet Take 1 tablet by mouth daily at 12 noon.    Allergies: Patient has no known allergies.   Review of Systems  Constitutional: Negative for chills, diaphoresis, fever, malaise/fatigue and weight loss.  HENT: Negative for congestion, sore throat and tinnitus.   Eyes: Negative for blurred vision, double vision and photophobia.  Respiratory: Negative for cough and wheezing.   Cardiovascular: Negative for chest pain and palpitations.  Gastrointestinal: Negative for blood in stool, diarrhea, nausea and vomiting.  Genitourinary: Negative for dysuria, frequency and urgency.  Musculoskeletal: Negative for joint pain and myalgias.  Skin: Negative for itching and rash.  Neurological: Negative for dizziness, focal weakness, weakness and headaches.  Endo/Heme/Allergies: Negative for environmental allergies and polydipsia. Does not bruise/bleed easily.  Psychiatric/Behavioral: Negative for depression and memory loss. The patient is  not nervous/anxious and does not have insomnia.      Objective:   Blood pressure 100/65, pulse 61, height 5\' 4"  (1.626 m), weight 146 lb 9.6 oz (66.5 kg), SpO2 100 %, unknown if currently breastfeeding.-Patient currently is breastfeeding Body mass index is 25.16 kg/m. General: Well Developed, well nourished, and in no acute distress.  Neuro: Alert and oriented x3, extra-ocular muscles intact, sensation grossly intact.  HEENT:[place]/AT, PERRLA, neck supple Skin: no gross rashes  Cardiac: Regular rate and rhythm Respiratory: Essentially clear to  auscultation bilaterally. Not using accessory muscles, speaking in full sentences.  Abdominal: not grossly distended Musculoskeletal: Ambulates w/o diff, FROM * 4 ext.  Vasc: less 2 sec cap RF, warm and pink  Psych:  No HI/SI, judgement and insight good, Euthymic mood. Full Affect.    No results found for this or any previous visit (from the past 2160 hour(s)).

## 2018-05-04 IMAGING — US US OB COMP LESS 14 WK
1 series · 15 of 28 positions shown · non-contrast
Comparison: None.

CLINICAL DATA: Acute onset of vaginal bleeding.  Initial encounter.

EXAM:
OBSTETRIC <14 WK US AND TRANSVAGINAL OB US
TECHNIQUE: Both transabdominal and transvaginal ultrasound examinations were
performed for complete evaluation of the gestation as well as the
maternal uterus, adnexal regions, and pelvic cul-de-sac.
Transvaginal technique was performed to assess early pregnancy.

[Series 1: us ob comp less 14 wk · 15 of 52 slices shown]
[im 1/52]
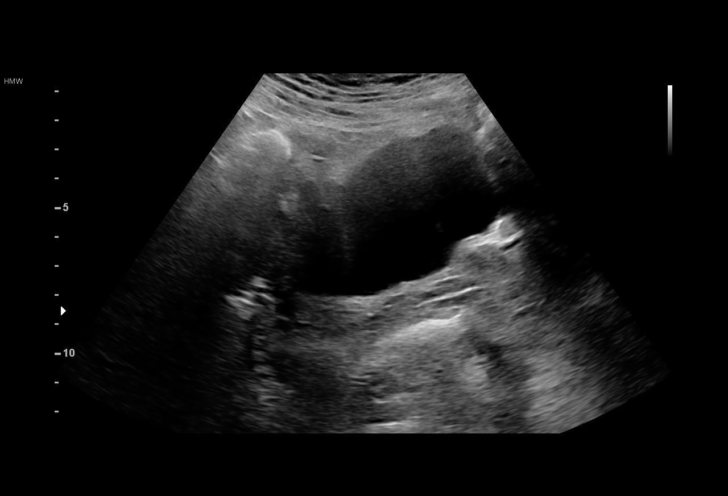
[im 4/52]
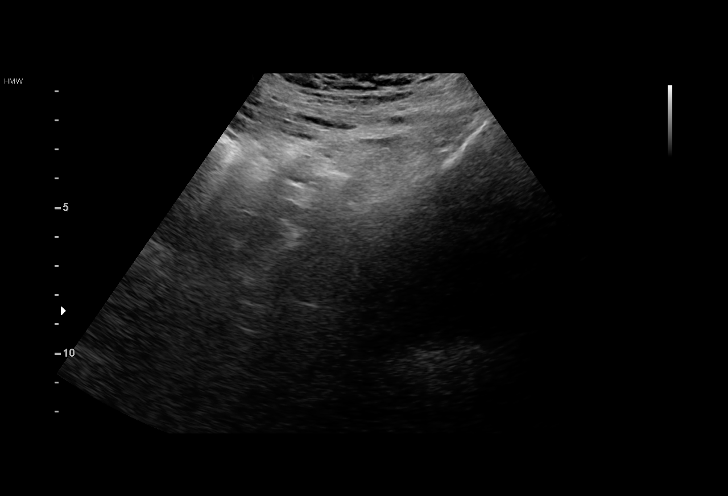
[im 8/52]
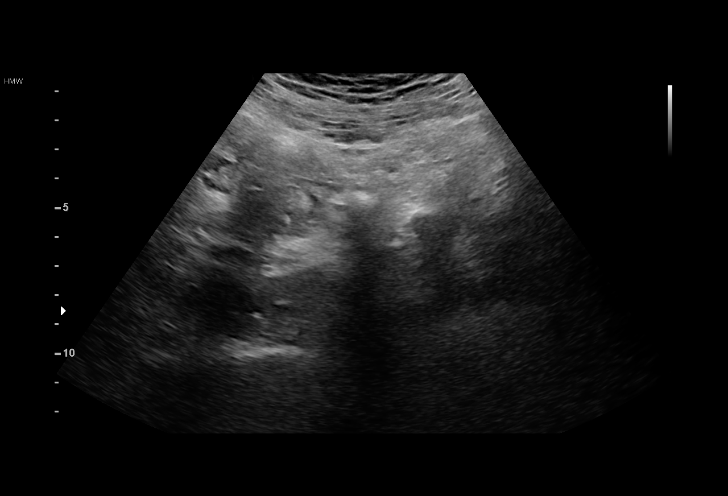
[im 12/52]
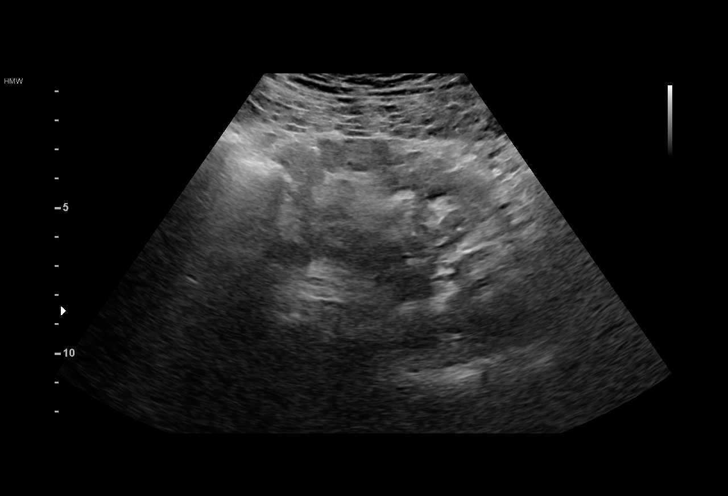
[im 16/52]
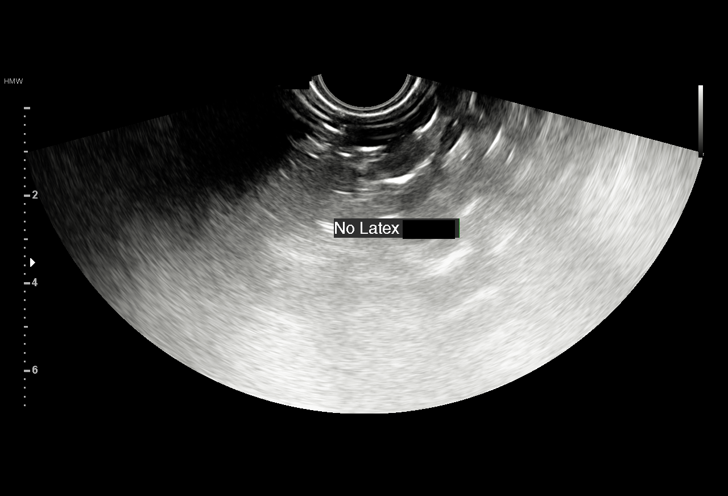
[im 19/52]
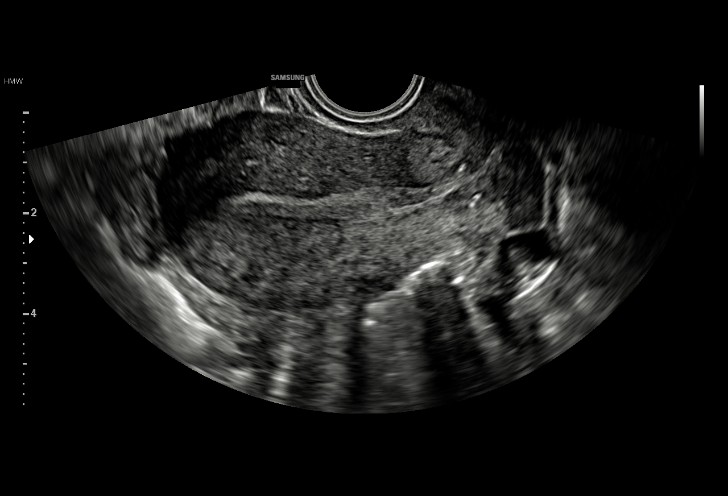
[im 23/52]
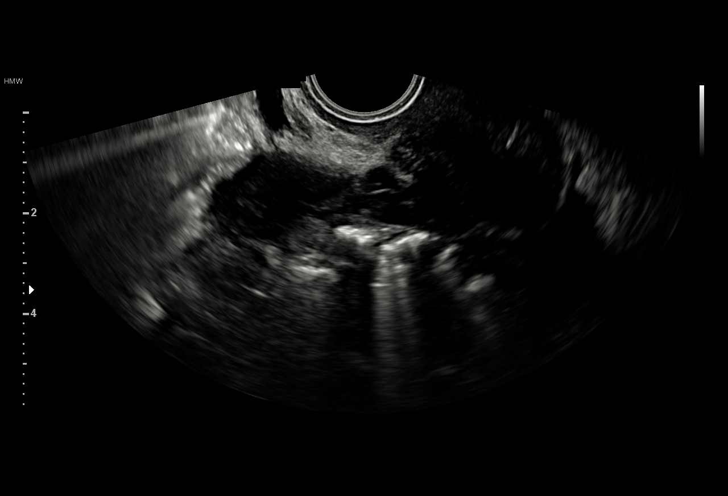
[im 27/52]
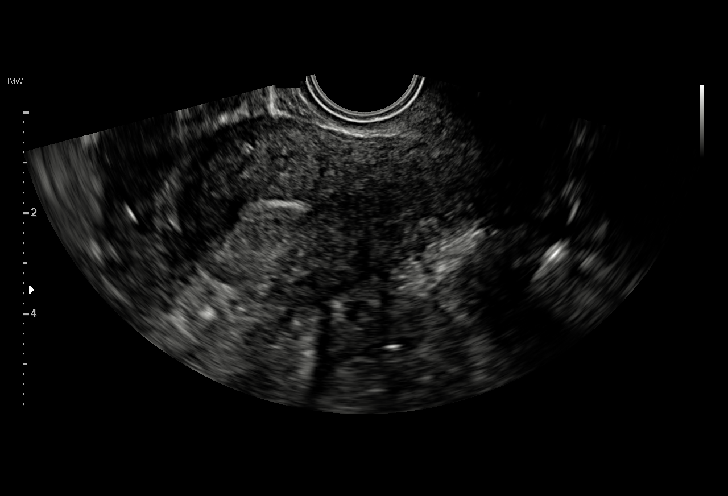
[im 29/52]
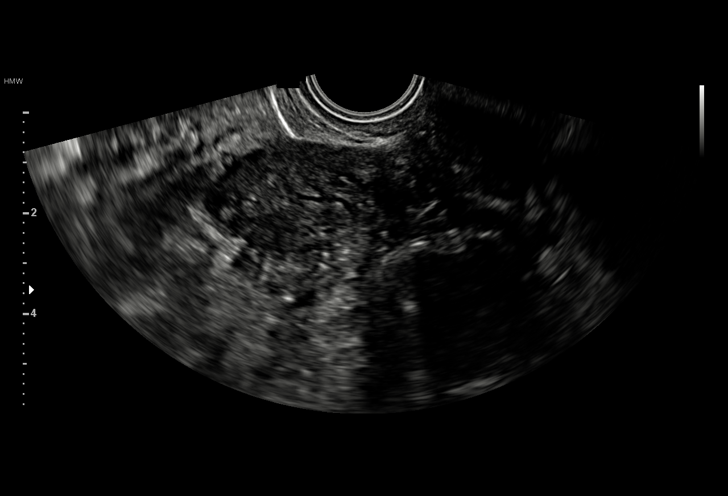
[im 33/52]
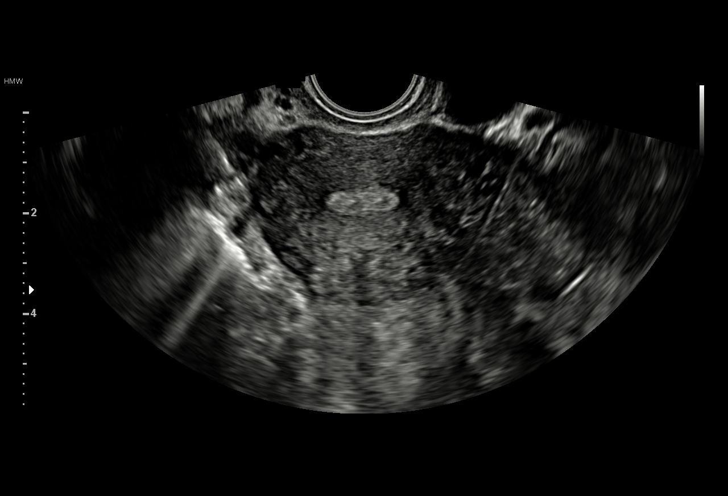
[im 36/52]
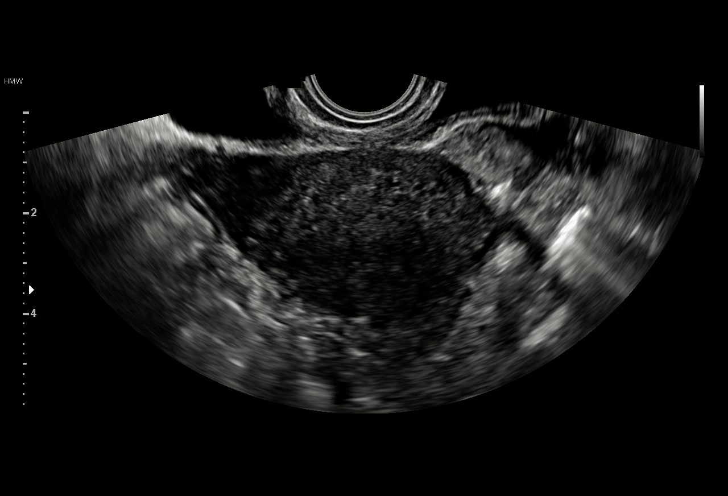
[im 40/52]
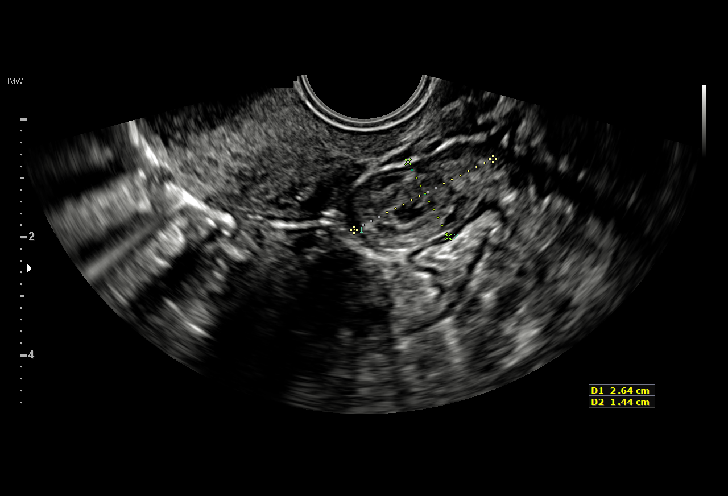
[im 44/52]
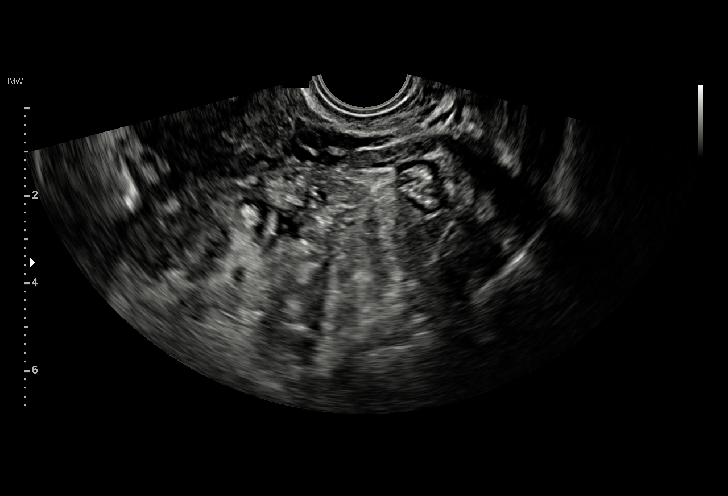
[im 48/52]
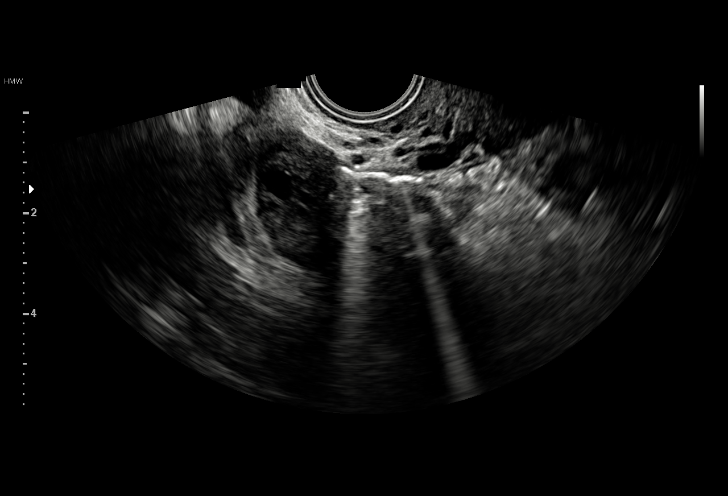
[im 52/52]
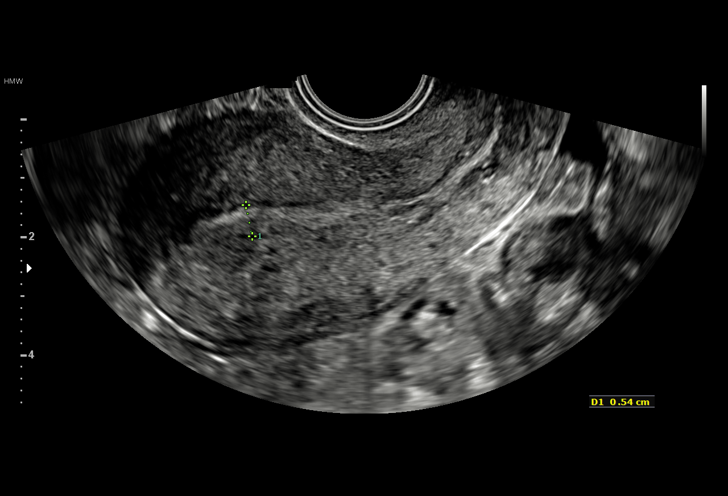

[15 of 28 positions shown; findings below may reference images not displayed]

FINDINGS: Intrauterine gestational sac: None seen.

Yolk sac:  N/A

Embryo:  N/A

Subchorionic hemorrhage:  None visualized.

Maternal uterus/adnexae: The uterus is unremarkable in appearance.
The endometrial echo complex measures 5 mm the thickness.

The ovaries are within normal limits. The right ovary measures 2.9 x
1.9 x 2.1 cm, while the left ovary measures 3.1 x 2.6 x 1.4 cm. No
suspicious adnexal masses are seen; there is no evidence for ovarian
torsion.

Trace free fluid is seen within the pelvic cul-de-sac.
IMPRESSION: No intrauterine gestational sac seen. No evidence for ectopic
pregnancy. The endometrial echo complex measures 5 mm in thickness,
suggesting that the patient is past the luteal phase of the cycle.

## 2018-06-30 NOTE — L&D Delivery Note (Signed)
Delivery Note At 4:29 AM a viable female was delivered in hands and knees position via Vaginal, Spontaneous (Presentation: cephalic; OA to LOT).  APGAR: 8/9; weight  pending.   Placenta status: S/C/I, central cord insertion.  Cord: 3VC  with the following complications: none.  Cord blood collected for typing  Anesthesia:  none Episiotomy: None Lacerations:  1st deg perineal Suture Repair: 3.0 vicryl Est. Blood Loss (mL):  300  Mom to postpartum.  Baby to Couplet care / Skin to Skin.  Juliene Pina, CNM 03/05/2019, 5:01 AM

## 2019-03-05 ENCOUNTER — Encounter (HOSPITAL_COMMUNITY): Payer: Self-pay

## 2019-03-05 ENCOUNTER — Other Ambulatory Visit: Payer: Self-pay

## 2019-03-05 ENCOUNTER — Inpatient Hospital Stay (HOSPITAL_COMMUNITY)
Admission: AD | Admit: 2019-03-05 | Payer: No Typology Code available for payment source | Source: Home / Self Care | Admitting: Obstetrics & Gynecology

## 2019-03-05 ENCOUNTER — Inpatient Hospital Stay (HOSPITAL_COMMUNITY)
Admission: AD | Admit: 2019-03-05 | Discharge: 2019-03-06 | DRG: 807 | Disposition: A | Discharge status: Home / Self Care | Payer: No Typology Code available for payment source

## 2019-03-05 DIAGNOSIS — Z20828 Contact with and (suspected) exposure to other viral communicable diseases: Secondary | ICD-10-CM | POA: Diagnosis present

## 2019-03-05 DIAGNOSIS — O99824 Streptococcus B carrier state complicating childbirth: Principal | ICD-10-CM | POA: Diagnosis present

## 2019-03-05 DIAGNOSIS — O43123 Velamentous insertion of umbilical cord, third trimester: Secondary | ICD-10-CM | POA: Diagnosis present

## 2019-03-05 DIAGNOSIS — Z3A38 38 weeks gestation of pregnancy: Secondary | ICD-10-CM

## 2019-03-05 DIAGNOSIS — O26893 Other specified pregnancy related conditions, third trimester: Secondary | ICD-10-CM | POA: Diagnosis present

## 2019-03-05 LAB — CBC
HCT: 41.2 % (ref 36.0–46.0)
Hemoglobin: 13.8 g/dL (ref 12.0–15.0)
MCH: 34.2 pg — ABNORMAL HIGH (ref 26.0–34.0)
MCHC: 33.5 g/dL (ref 30.0–36.0)
MCV: 102 fL — ABNORMAL HIGH (ref 80.0–100.0)
Platelets: 191 10*3/uL (ref 150–400)
RBC: 4.04 MIL/uL (ref 3.87–5.11)
RDW: 12.7 % (ref 11.5–15.5)
WBC: 13 10*3/uL — ABNORMAL HIGH (ref 4.0–10.5)
nRBC: 0 % (ref 0.0–0.2)

## 2019-03-05 LAB — TYPE AND SCREEN
ABO/RH(D): O POS
Antibody Screen: NEGATIVE

## 2019-03-05 LAB — ABO/RH: ABO/RH(D): O POS

## 2019-03-05 LAB — RPR: RPR Ser Ql: NONREACTIVE

## 2019-03-05 LAB — SARS CORONAVIRUS 2 BY RT PCR (HOSPITAL ORDER, PERFORMED IN ~~LOC~~ HOSPITAL LAB): SARS Coronavirus 2: NEGATIVE

## 2019-03-05 MED ORDER — ONDANSETRON HCL 4 MG/2ML IJ SOLN: 4.0000 mg | INTRAMUSCULAR | Status: DC | PRN

## 2019-03-05 MED ORDER — SOD CITRATE-CITRIC ACID 500-334 MG/5ML PO SOLN: 30.0000 mL | ORAL | Status: DC | PRN

## 2019-03-05 MED ORDER — ACETAMINOPHEN 325 MG PO TABS
650.0000 mg | ORAL_TABLET | ORAL | Status: DC | PRN
  Administered 2019-03-05: 08:00:00 650 mg via ORAL
  Filled 2019-03-05: qty 2

## 2019-03-05 MED ORDER — SODIUM CHLORIDE 0.9 % IV SOLN: 250.0000 mL | INTRAVENOUS | Status: DC | PRN

## 2019-03-05 MED ORDER — SODIUM CHLORIDE 0.9% FLUSH: 3.0000 mL | Freq: Two times a day (BID) | INTRAVENOUS | Status: DC

## 2019-03-05 MED ORDER — ACETAMINOPHEN 325 MG PO TABS: 650.0000 mg | ORAL_TABLET | ORAL | Status: DC | PRN

## 2019-03-05 MED ORDER — ONDANSETRON HCL 4 MG PO TABS: 4.0000 mg | ORAL_TABLET | ORAL | Status: DC | PRN

## 2019-03-05 MED ORDER — OXYTOCIN BOLUS FROM INFUSION: 500.0000 mL | Freq: Once | INTRAVENOUS | Status: DC

## 2019-03-05 MED ORDER — SODIUM CHLORIDE 0.9% FLUSH: 3.0000 mL | INTRAVENOUS | Status: DC | PRN

## 2019-03-05 MED ORDER — BENZOCAINE-MENTHOL 20-0.5 % EX AERO
1.0000 "application " | INHALATION_SPRAY | CUTANEOUS | Status: DC | PRN
  Administered 2019-03-05: 1 via TOPICAL
  Filled 2019-03-05: qty 56

## 2019-03-05 MED ORDER — OXYTOCIN 40 UNITS IN NORMAL SALINE INFUSION - SIMPLE MED
2.5000 [IU]/h | INTRAVENOUS | Status: DC
  Filled 2019-03-05: qty 1000

## 2019-03-05 MED ORDER — LACTATED RINGERS IV SOLN: 500.0000 mL | INTRAVENOUS | Status: DC | PRN

## 2019-03-05 MED ORDER — LACTATED RINGERS IV SOLN: INTRAVENOUS | Status: DC

## 2019-03-05 MED ORDER — FLEET ENEMA 7-19 GM/118ML RE ENEM: 1.0000 | ENEMA | Freq: Every day | RECTAL | Status: DC | PRN

## 2019-03-05 MED ORDER — COCONUT OIL OIL
1.0000 "application " | TOPICAL_OIL | Status: DC | PRN
  Administered 2019-03-05: 1 via TOPICAL

## 2019-03-05 MED ORDER — DIPHENHYDRAMINE HCL 25 MG PO CAPS: 25.0000 mg | ORAL_CAPSULE | Freq: Four times a day (QID) | ORAL | Status: DC | PRN

## 2019-03-05 MED ORDER — IBUPROFEN 600 MG PO TABS
600.0000 mg | ORAL_TABLET | Freq: Four times a day (QID) | ORAL | Status: DC
  Administered 2019-03-05 – 2019-03-06 (×4): 600 mg via ORAL
  Filled 2019-03-05 (×4): qty 1

## 2019-03-05 MED ORDER — TETANUS-DIPHTH-ACELL PERTUSSIS 5-2.5-18.5 LF-MCG/0.5 IM SUSP: 0.5000 mL | Freq: Once | INTRAMUSCULAR | Status: DC

## 2019-03-05 MED ORDER — PRENATAL MULTIVITAMIN CH
1.0000 | ORAL_TABLET | Freq: Every day | ORAL | Status: DC
  Administered 2019-03-05: 1 via ORAL
  Filled 2019-03-05: qty 1

## 2019-03-05 MED ORDER — BISACODYL 10 MG RE SUPP: 10.0000 mg | Freq: Every day | RECTAL | Status: DC | PRN

## 2019-03-05 MED ORDER — DIBUCAINE (PERIANAL) 1 % EX OINT
1.0000 "application " | TOPICAL_OINTMENT | CUTANEOUS | Status: DC | PRN
  Administered 2019-03-05: 1 via RECTAL
  Filled 2019-03-05: qty 28

## 2019-03-05 MED ORDER — AMPICILLIN SODIUM 2 G IJ SOLR
2.0000 g | Freq: Once | INTRAMUSCULAR | Status: AC
  Administered 2019-03-05: 2 g via INTRAVENOUS
  Filled 2019-03-05: qty 2000

## 2019-03-05 MED ORDER — ZOLPIDEM TARTRATE 5 MG PO TABS: 5.0000 mg | ORAL_TABLET | Freq: Every evening | ORAL | Status: DC | PRN

## 2019-03-05 MED ORDER — LIDOCAINE HCL (PF) 1 % IJ SOLN
30.0000 mL | INTRAMUSCULAR | Status: AC | PRN
  Administered 2019-03-05: 30 mL via SUBCUTANEOUS
  Filled 2019-03-05: qty 30

## 2019-03-05 MED ORDER — ONDANSETRON HCL 4 MG/2ML IJ SOLN: 4.0000 mg | Freq: Four times a day (QID) | INTRAMUSCULAR | Status: DC | PRN

## 2019-03-05 MED ORDER — SENNOSIDES-DOCUSATE SODIUM 8.6-50 MG PO TABS
2.0000 | ORAL_TABLET | ORAL | Status: DC
  Administered 2019-03-05: 2 via ORAL
  Filled 2019-03-05: qty 2

## 2019-03-05 MED ORDER — OXYTOCIN 10 UNIT/ML IJ SOLN
10.0000 [IU] | Freq: Once | INTRAMUSCULAR | Status: DC
  Filled 2019-03-05: qty 1

## 2019-03-05 MED ORDER — SIMETHICONE 80 MG PO CHEW: 80.0000 mg | CHEWABLE_TABLET | ORAL | Status: DC | PRN

## 2019-03-05 MED ORDER — WITCH HAZEL-GLYCERIN EX PADS
1.0000 "application " | MEDICATED_PAD | CUTANEOUS | Status: DC | PRN
  Administered 2019-03-05: 1 via TOPICAL

## 2019-03-05 NOTE — Lactation Note (Signed)
This note was copied from a baby's chart. Lactation Consultation Note  Patient Name: Tammy Holmes JSHFW'[id number] Date: 03/05/2019 Reason for consult: Initial assessment;Mother's request;Early term 37-38.6wks  LC in to visit with P2 Mom of ET infant at 45 hrs old.  Baby has been sleepy following one 10 minute feeding within the first hour of life.  Mom resting comfortably in bed with baby STS on her chest.  Spent time talking about normal newborn feeding behavior in first day of life.  Reassured Mom and FOB about sleepiness.    Reviewed and demonstrated breast massage and hand expression.  Colostrum drops expressed into spoon and Mom placed drops in baby's mouth.    Assisted with positioning baby in football hold on left side, but baby showed no cues and didn't latch.  Mom shared her poor breastfeeding experience with her first son (2 yrs ago) where he didn't latch, lost weight, and Mom pumped and bottle fed baby until he was about 10 lbs.  At that time baby was able to latch and breastfeed well, which he did for a year.  Mom has anxiety about the same thing happening with this baby.  Mom states she had a great milk supply with her first baby.  To assist with positioning and latching when baby starts showing cues he is hungry.  Mom to call out to desk to request help.  Lactation brochure left with Mom.  Mom aware of IP and OP lactation support available to her.    Consult Status Consult Status: Follow-up Date: 03/05/19 Follow-up type: In-patient    Broadus John 03/05/2019, 11:34 AM

## 2019-03-05 NOTE — H&P (Signed)
OB ADMISSION/ HISTORY & PHYSICAL:  Admission Date: 03/05/2019  1:45 AM  Admit Diagnosis: CTX 38 wks    Tammy Holmes is a 28 y.o. female presenting for contractions and spotting.  Ctx on and off for past few days, started to be intense about 2300 tonight. Seen in office yesterday and cvx 4/80/-2. Denies HA/NV/RUQ pain. + FM.  Planning natural labor, has spouse for support and expecting doula to join.  .  Prenatal History: [id number]   EDC : 03/17/2019, by [facility]  Prenatal care at [facility] since 14.5 wks   Prenatal course complicated by: Dependent edema GBS positive  Prenatal Labs: ABO, Rh:   O pos Antibody:  neg Rubella:   immune RPR:   NR HBsAg:   neg HIV:   neg GBS:   positive 1 hr Glucola : 103 Genetic Screening: declined Ultrasound: normal anatomy, posterior placenta, undisclosed gender Growth sono 35 wks 60%tile, 6'2", vertex, AFI 9    Maternal Diabetes: No Genetic Screening: Declined Maternal Ultrasounds/Referrals: Normal Fetal Ultrasounds or other Referrals:  None Maternal Substance Abuse:  No Significant Maternal Medications:  None Significant Maternal Lab Results:  Group B Strep positive Other Comments:  None  Medical / Surgical History :  Past medical history:  Past Medical History:  Diagnosis Date  . Medical history non-contributory      Past surgical history:  Past Surgical History:  Procedure Laterality Date  . NO PAST SURGERIES       Family History:  Family History  Problem Relation Age of Onset  . Diabetes Mother   . Hyperlipidemia Mother   . Hypertension Mother   . Hyperlipidemia Maternal Grandmother   . Hypertension Maternal Grandmother   . Stroke Maternal Grandmother      Social History:  reports that she has never smoked. She has never used smokeless tobacco. She reports that she does not drink alcohol or use drugs.   Allergies: Patient has no known allergies.   Current Medications at time of  admission:  Medications Prior to Admission  Medication Sig Dispense Refill Last Dose  . Prenatal Vit-Fe Fumarate-FA (PRENATAL MULTIVITAMIN) TABS tablet Take 1 tablet by mouth daily at 12 noon.        Review of Systems: ROS  Physical Exam: Vital signs and nursing notes reviewed.  ED Triage Vitals  Enc Vitals Group     BP      Pulse      Resp      Temp      Temp src      SpO2      Weight      Height      Head Circumference      Peak Flow      Pain Score      Pain Loc      Pain Edu?      Excl. in [place]?      General: AAO x 3, NAD, coping well Heart: RRR Lungs:CTAB Abdomen: Gravid, NT, Leopold's vertex, fetal spine to maternal L Extremities: +1 edema Genitalia / VE: Dilation: 8 / 90/-2, BBOW  FHR: 135 BPM, mod variability, + accels, no decels TOCO: Ctx q 2-3 min  Labs:   Pending T&S, CBC, RPR  No results for input(s): WBC, HGB, HCT, PLT in the last 72 hours.     Assessment:  28 y.o. G3P1001 at [redacted]w[redacted]d  1. Active stage of labor 2. FHR category 1 3. GBS positive 4. Desires natural labor 5. Breastfeeding 6.  Placenta disposal per patient request  Plan:  1. Admit to BS 2. Routine L&D orders 3. Analgesia/anesthesia PRN  4. Expectant management 5. Anticipate NSVB 6. Ampicillin per GBS protocol in advanced labor  Dr Cherly Hensenousins notified of admission / plan of care   Neta Mendsaniela C  CNM, MSN 03/05/2019, 2:08 AM

## 2019-03-05 NOTE — Progress Notes (Signed)
INTERVAL NOTE: S/P NSVD  Live born female  Birth Weight: 8 lb 5.3 oz (3779 g) APGAR: 8, 8  Newborn Delivery   Birth date/time: [birth year]/2019 04:29:00 Delivery type: Vaginal, Spontaneous     Baby name: no name yet Delivering provider: Derrell Lolling C  Episiotomy:None   Lacerations:1st degree;Perineal   S:  Sitting in bed, BFing - no latch yet, waiting for lactation consult, min cramping, (+) voids, small bleed, denies HA/NV/dizziness  O:   Vitals:   03/05/19 0549 03/05/19 0640 03/05/19 0759 03/05/19 1146  BP: 128/87 (!) 130/59 131/75 118/68  Pulse: 84 77 84 70  Resp: 18 18 18 18   Temp:  98.2 F (36.8 C) 98.4 F (36.9 C) 98 F (36.7 C)  TempSrc:  Oral Oral Oral  SpO2:  100%    Weight:      Height:         AAO x 3, NAD  FF bellow U  Scant lochia  A / P:    Principal Problem:   Postpartum care following vaginal delivery 9/5 Active Problems:   SVD (spontaneous vaginal delivery)   Perineal laceration with delivery, first degree  PPD #0  Stable post partum  Routine PP orders  Miray Mancino, CNM, MSN  03/05/2019 1:01 PM

## 2019-03-05 NOTE — MAU Note (Signed)
Melina Copa, CNM at bedside upon patient's arrival.  Patient reports contractions that have been ongoing all night.  Some bloody show, no large gush of fluid.  + FM.

## 2019-03-05 NOTE — Lactation Note (Signed)
This note was copied from a baby's chart. Lactation Consultation Note  Patient Name: Tammy Holmes Date: 03/05/2019 Reason for consult: Follow-up assessment;Early term 37-38.6wks;Mother's request  Mom called for latch assist, baby already nursing when entering the room; but latch was slightly shallow and mom voiced some discomfort. LC showed mom how to break the latch and repositioned baby, this time STS on her right breast in cross cradle position, mom doing typical cradle.   Noticed that nipple was pinched, squeezed some colostrum prior re-latching, mom willing to relatch baby on the same breast. Waited for baby to open his mouth wide and he latched deep into the breast, as time went by mom also noted that feedings were more comfortable, a few audible swallows noted upon breast compressions. Baby fed for 10 minutes while in [facility] consultation; but mom kept switching to cradle, advised her to keep baby in cross cradle due to developmental milestones, she voiced understanding.  Reviewed normal newborn behavior, feeding cues and cluster feeding. LC also stressed on treating sore nipples with her colostrum as the first agent for breast care, mom also brought some organic nipple butter from home. Dad present and supportive and engaged during [facility] consultation; they're telling [place] about their first experience with baby # 1, he's now 27 years old.  Feeding plan:  1. Encouraged mom to feed baby STS 8-12 times/24 hours or sooner if feeding cues are present; paying attention to positioning to achieve a deep latch 2. Hand expression and finger feeding were also encouraged 3. Mom will use her EBM for breast care and nipple butter if needed  Parents reported all questions and concerns were answered, they're both aware of [place] services and will call PRN.  Maternal Data    Feeding Feeding Type: Breast Fed  LATCH Score Latch: Grasps breast easily, tongue down, lips flanged, rhythmical  sucking.  Audible Swallowing: A few with stimulation  Type of Nipple: Everted at rest and after stimulation  Comfort (Breast/Nipple): Filling, red/small blisters or bruises, mild/mod discomfort(from previous feeding)  Hold (Positioning): Assistance needed to correctly position infant at breast and maintain latch.  LATCH Score: 7  Interventions Interventions: Breast feeding basics reviewed;Assisted with latch;Skin to skin;Breast massage;Hand express;Breast compression;Adjust position;Support pillows  Lactation Tools Discussed/Used     Consult Status Consult Status: Follow-up Date: 03/06/19 Follow-up type: In-patient    Tammy Holmes 03/05/2019, 7:42 PM

## 2019-03-06 LAB — CBC
HCT: 35 % — ABNORMAL LOW (ref 36.0–46.0)
Hemoglobin: 11.7 g/dL — ABNORMAL LOW (ref 12.0–15.0)
MCH: 34.1 pg — ABNORMAL HIGH (ref 26.0–34.0)
MCHC: 33.4 g/dL (ref 30.0–36.0)
MCV: 102 fL — ABNORMAL HIGH (ref 80.0–100.0)
Platelets: 184 10*3/uL (ref 150–400)
RBC: 3.43 MIL/uL — ABNORMAL LOW (ref 3.87–5.11)
RDW: 12.9 % (ref 11.5–15.5)
WBC: 12.7 10*3/uL — ABNORMAL HIGH (ref 4.0–10.5)
nRBC: 0 % (ref 0.0–0.2)

## 2019-03-06 NOTE — Discharge Instructions (Signed)
Call if temperature greater than equal to 100.4, nothing per vagina for 4-6 weeks or severe nausea vomiting, increased incisional pain , drainage or redness in the incision site, no straining with bowel movements, showers no bath °

## 2019-03-06 NOTE — Progress Notes (Signed)
PPD1 SVD:   S:  Pt reports feeling . Request early discharge / Tolerating po/ Voiding without problems/ No n/v/ Bleeding is light/ Pain controlled withibuprofen (OTC)  Newborn info live female   O:  A & O x 3 / VS: Blood pressure 105/68, pulse (!) 55, temperature 98 F (36.7 C), temperature source Oral, resp. rate 18, height 5\' 4"  (1.626 m), weight 83.6 kg, SpO2 100 %, unknown if currently breastfeeding.  LABS:  Results for orders placed or performed during the hospital encounter of 03/05/19 (from the past 24 hour(s))  CBC     Status: Abnormal   Collection Time: 03/06/19  7:41 AM  Result Value Ref Range   WBC 12.7 (H) 4.0 - 10.5 K/uL   RBC 3.43 (L) 3.87 - 5.11 MIL/uL   Hemoglobin 11.7 (L) 12.0 - 15.0 g/dL   HCT 35.0 (L) 36.0 - 46.0 %   MCV 102.0 (H) 80.0 - 100.0 fL   MCH 34.1 (H) 26.0 - 34.0 pg   MCHC 33.4 30.0 - 36.0 g/dL   RDW 12.9 11.5 - 15.5 %   Platelets 184 150 - 400 K/uL   nRBC 0.0 0.0 - 0.2 %    I&O: I/O last 3 completed shifts: In: -  Out: 300 [Blood:300]   No intake/output data recorded.  Lungs: pectus excavatum noted  Heart: regular rate and rhythm, S1, S2 normal, no murmur, click, rub or gallop  Abdomen: fundus to umb to right  Perineum: healing with good reapproximation  Lochia: light  Extremities:no redness or tenderness in the calves or thighs, no edema    A/P: PPD # 1/ [id number]  Doing well  Continue routine post partum orders  D/c home  D/c instructions reviewed  f/u 6 wks

## 2019-03-06 NOTE — Lactation Note (Signed)
This note was copied from a baby's chart. Lactation Consultation Note  Patient Name: Tammy Holmes Date: 03/06/2019   Infant is still sleeping from circ (I had dropped by around 1100 to see Mom & infant, but Mom was sleeping, then).   Mom shared that her 1st child had a lip & tongue-tie. She plans on making an appt with Dr. Audie Pinto for this week for him to check. Mom also gets chiropractic treatments & infant will likely get his 1st chiropractic treatment this week, as well.    Matthias Hughs [facility] 03/06/2019, 1:14 PM

## 2019-03-06 NOTE — Discharge Summary (Signed)
Obstetric Discharge Summary Reason for Admission: onset of labor Prenatal Procedures: ultrasound Intrapartum Procedures: spontaneous vaginal delivery and GBS prophylaxis Postpartum Procedures: none Complications-Operative and Postpartum: 1st degree perineal laceration Hemoglobin  Date Value Ref Range Status  03/06/2019 11.7 (L) 12.0 - 15.0 g/dL Final   HCT  Date Value Ref Range Status  03/06/2019 35.0 (L) 36.0 - 46.0 % Final    Physical Exam:  General: alert, cooperative and no distress Lochia: appropriate Uterine Fundus: firm Incision: n/a DVT Evaluation: No evidence of DVT seen on physical exam.  Discharge Diagnoses: Term Pregnancy-delivered  Discharge Information: Date: 03/06/2019 Activity: pelvic rest Diet: routine Medications: PNV and Ibuprofen Condition: stable Instructions: refer to practice specific booklet Discharge to: home Follow-up Information    Tammy Holmes, CNM Follow up in 6 week(s).   Specialty: Obstetrics and Gynecology Contact information: [address] [place] [address] [phone]           Newborn Data: Live born female  Birth Weight: 8 lb 5.3 oz (3779 g) APGAR: 29, 8  Newborn Delivery   Birth date/time: 03/05/2019 04:29:00 Delivery type: Vaginal, Spontaneous      Home with mother.  Aydin Cavalieri A Marticia Reifschneider 03/06/2019, 10:29 AM

## 2019-03-06 NOTE — Lactation Note (Signed)
This note was copied from a baby's chart. Lactation Consultation Note  Patient Name: Boy Sina Sumpter [id number] Date: 03/06/2019   Mom is still having issues with getting a deep latch. She is also having some issues with nipple soreness. I briefly helped her latch, but then the nursery was ready for infant to receive his circumcision.  While infant was getting circ'd, I taught Mom a different technique of hand expression, which worked well. Mom expresses colostrum easily. I also provided her with a hand pump. I observed her pump briefly & then obtained a size 27 flange.   OB walked into room. This LC to return.    Matthias Hughs [facility] 03/06/2019, 10:25 AM

## 2019-07-26 ENCOUNTER — Other Ambulatory Visit: Payer: Self-pay | Admitting: Sports Medicine

## 2019-07-26 ENCOUNTER — Ambulatory Visit: Payer: No Typology Code available for payment source | Admitting: Sports Medicine

## 2019-07-26 ENCOUNTER — Other Ambulatory Visit: Payer: Self-pay

## 2019-07-26 ENCOUNTER — Ambulatory Visit (INDEPENDENT_AMBULATORY_CARE_PROVIDER_SITE_OTHER): Payer: No Typology Code available for payment source

## 2019-07-26 ENCOUNTER — Encounter: Payer: Self-pay | Admitting: Sports Medicine

## 2019-07-26 VITALS — Temp 96.9°F

## 2019-07-26 DIAGNOSIS — M779 Enthesopathy, unspecified: Secondary | ICD-10-CM | POA: Diagnosis not present

## 2019-07-26 DIAGNOSIS — M79672 Pain in left foot: Secondary | ICD-10-CM | POA: Diagnosis not present

## 2019-07-26 DIAGNOSIS — M7742 Metatarsalgia, left foot: Secondary | ICD-10-CM

## 2019-07-26 DIAGNOSIS — M778 Other enthesopathies, not elsewhere classified: Secondary | ICD-10-CM | POA: Diagnosis not present

## 2019-07-26 DIAGNOSIS — Z872 Personal history of diseases of the skin and subcutaneous tissue: Secondary | ICD-10-CM

## 2019-07-26 DIAGNOSIS — M79674 Pain in right toe(s): Secondary | ICD-10-CM

## 2019-07-26 MED ORDER — NEOMYCIN-POLYMYXIN-HC 3.5-10000-1 OT SOLN: OTIC | 0 refills | Status: DC

## 2019-07-26 NOTE — Patient Instructions (Addendum)
For tennis shoes recommend:  Anne Shutter Ascis New balance Saucony Can be purchased at [employer] sports or [employer]  Vionic  SAS Can be purchased at [employer] or [employer]   For work shoes recommend: [employer] Work [employer]  Can be purchased at a variety of places or Scientist, product/process development   For casual shoes recommend: Vionic  Can be purchased at [employer] or [employer]    For orthotics recommend: Superfeet Can be purchased at [employer] sports or [employer]  Recommend daily massage to the ball of left foot before getting out of bed May try topical pain cream: Voltaren or Aspercreme for pain get OTC

## 2019-07-26 NOTE — Progress Notes (Addendum)
Subjective: Tammy Holmes is a 29 y.o. female patient who presents to office for evaluation of left foot pain. Patient complains of progressive pain especially over the last 2.5 months in the left foot at the ball. Ranks pain 7/10 and is now interferring with daily activities. Patient has tried stretching with no relief in symptoms. Patient also admits that there is some soreness to right hallux nail with history of ingrown of which she trimmed out herself, patient denies any other pedal complaints. Denies injury/trip/fall/sprain/any causative factors.   Patient is currently breastfeeding.   Review of Systems  All other systems reviewed and are negative.   Patient Active Problem List   Diagnosis Date Noted  . SVD (spontaneous vaginal delivery) 03/05/2019  . Perineal laceration with delivery, first degree 03/05/2019  . De Quervain's tenosynovitis, bilateral 02/03/2018  . Postpartum amenorrhea- no periods since child- 8 mo ago 02/03/2018  . Carpal tunnel syndrome during pregnancy 02/03/2018  . History of gastroesophageal reflux (GERD) 02/03/2018  . Status post vacuum-assisted vaginal delivery: indication maternal exhaustion, fetal heart rate indication  06/02/2017  . Postpartum care following vaginal delivery 9/5 06/02/2017  . Obstetrical laceration: median episitomy  06/02/2017    Current Outpatient Medications on File Prior to Visit  Medication Sig Dispense Refill  . Prenatal Vit-Fe Fumarate-FA (PRENATAL MULTIVITAMIN) TABS tablet Take 1 tablet by mouth daily at 12 noon.     No current facility-administered medications on file prior to visit.    No Known Allergies  Objective:  General: Alert and oriented x3 in no acute distress  Dermatology: No open lesions bilateral lower extremities, no webspace macerations, no ecchymosis bilateral, all nails x 10 are well manicured with mild blanchable erythema at right hallux medial and lateral borders with history of patient recently trimming  nail.  Vascular: Dorsalis Pedis and Posterior Tibial pedal pulses palpable, Capillary Fill Time 3 seconds,(+) pedal hair growth bilateral, no edema bilateral lower extremities, Temperature gradient within normal limits.  Neurology: Gross sensation intact via light touch bilateral.  Negative Mulder sign left foot.  Musculoskeletal: Mild tenderness with palpation at ball of the left foot.  There is also subjective pain that radiates to the dorsal and lateral ankle on the left likely from compensation.  Range of motion within normal limits with mild guarding with range of motion to lesser MPJs on left.  Normal arch.  Gait: Antalgic gait  Xrays  Left foot   Impression: No acute osseous findings  Assessment and Plan: Problem List Items Addressed This Visit    None    Visit Diagnoses    Left foot pain    -  Primary   Metatarsalgia of left foot       Capsulitis       H/O ingrown nail       Toe pain, right           -Complete examination performed -Xrays reviewed -Discussed treatment options -No medication given at this time since she is breast-feeding however did advise patient if pain continues may benefit from a steroid orally and to temporarily stop breast feeding during the timeframe that she is on the medication -Rx Corticosporin drops to use at right great toenail to soak both feet with Epson salt and warm water -Advised patient to refrain from stretching and may use gentle massage or topical pain cream for left foot pain -Advised good supportive shoes and to get over-the-counter super feet orthotic meanwhile dispensed metatarsal pad sleeve for patient to use at left foot -  Patient to return to office as needed or sooner if condition worsens.  Landis Martins, DPM   For tennis shoes recommend:  Kandy Garrison Ascis New balance Saucony Can be purchased at [employer] sports or [employer] Can be purchased at [employer] or [employer]   For work shoes recommend: [employer]  Work [employer]  Can be purchased at a variety of places or Engineer, maintenance (IT)   For casual shoes recommend: Vionic  Can be purchased at [employer] or [employer]

## 2019-07-27 ENCOUNTER — Encounter: Payer: Self-pay | Admitting: Sports Medicine

## 2019-07-28 NOTE — Telephone Encounter (Signed)
Mailed copy of Dr. Wynema Birch note 07/26/2019 to pt.

## 2019-09-01 ENCOUNTER — Encounter: Payer: Self-pay | Admitting: Sports Medicine

## 2020-06-18 ENCOUNTER — Other Ambulatory Visit (HOSPITAL_COMMUNITY): Payer: Self-pay | Admitting: Obstetrics and Gynecology

## 2020-06-18 ENCOUNTER — Other Ambulatory Visit: Payer: Self-pay

## 2020-06-18 ENCOUNTER — Encounter (HOSPITAL_BASED_OUTPATIENT_CLINIC_OR_DEPARTMENT_OTHER): Payer: Self-pay | Admitting: Obstetrics and Gynecology

## 2020-06-18 ENCOUNTER — Other Ambulatory Visit: Payer: Self-pay | Admitting: Obstetrics and Gynecology

## 2020-06-18 DIAGNOSIS — O020 Blighted ovum and nonhydatidiform mole: Secondary | ICD-10-CM

## 2020-06-18 DIAGNOSIS — Z419 Encounter for procedure for purposes other than remedying health state, unspecified: Secondary | ICD-10-CM

## 2020-06-18 NOTE — Progress Notes (Addendum)
ADDENDUM:  Received pt's positive covid test from pt via email, printed copy of results and placed in chart.   Spoke w/ via phone for pre-op interview--- PT Lab needs dos---- CBC              Lab results------ no COVID test ------ no test needed, per pt positive covid test done @[facility] , pt to get copy of results prior to surgery Arrive at ------- 1000 NPO after MN NO Solid Food.  Clear liquids from MN until--- 0900 Medications to take morning of surgery ----- NONE Diabetic medication ----- n/a Patient Special Instructions ----- n/a Pre-Op special Istructions ----- pre-op orders needs second sign, add-on today Patient verbalized understanding of instructions that were given at this phone interview. Patient denies shortness of breath, chest pain, fever, cough at this phone interview.

## 2020-06-20 ENCOUNTER — Encounter (HOSPITAL_BASED_OUTPATIENT_CLINIC_OR_DEPARTMENT_OTHER)
Admission: RE | Disposition: A | Discharge status: Home / Self Care | Payer: Self-pay | Source: Ambulatory Visit | Attending: Obstetrics and Gynecology

## 2020-06-20 ENCOUNTER — Ambulatory Visit (HOSPITAL_BASED_OUTPATIENT_CLINIC_OR_DEPARTMENT_OTHER)
Admission: RE | Admit: 2020-06-20 | Discharge: 2020-06-20 | Disposition: A | Discharge status: Home / Self Care | Payer: No Typology Code available for payment source | Source: Ambulatory Visit | Attending: Obstetrics and Gynecology | Admitting: Obstetrics and Gynecology

## 2020-06-20 ENCOUNTER — Other Ambulatory Visit: Payer: Self-pay

## 2020-06-20 ENCOUNTER — Ambulatory Visit (HOSPITAL_BASED_OUTPATIENT_CLINIC_OR_DEPARTMENT_OTHER): Payer: No Typology Code available for payment source | Admitting: Anesthesiology

## 2020-06-20 ENCOUNTER — Encounter (HOSPITAL_BASED_OUTPATIENT_CLINIC_OR_DEPARTMENT_OTHER): Payer: Self-pay | Admitting: Obstetrics and Gynecology

## 2020-06-20 ENCOUNTER — Ambulatory Visit (HOSPITAL_COMMUNITY): Payer: No Typology Code available for payment source

## 2020-06-20 ENCOUNTER — Ambulatory Visit (HOSPITAL_COMMUNITY)
Admission: RE | Admit: 2020-06-20 | Discharge: 2020-06-20 | Disposition: A | Discharge status: Home / Self Care | Payer: No Typology Code available for payment source | Source: Ambulatory Visit | Attending: Obstetrics and Gynecology | Admitting: Obstetrics and Gynecology

## 2020-06-20 DIAGNOSIS — O020 Blighted ovum and nonhydatidiform mole: Secondary | ICD-10-CM | POA: Diagnosis not present

## 2020-06-20 HISTORY — DX: Blighted ovum and nonhydatidiform mole: O02.0

## 2020-06-20 HISTORY — PX: DILATION AND EVACUATION: SHX1459

## 2020-06-20 LAB — CBC
HCT: 37.6 % (ref 36.0–46.0)
Hemoglobin: 12.9 g/dL (ref 12.0–15.0)
MCH: 32.8 pg (ref 26.0–34.0)
MCHC: 34.3 g/dL (ref 30.0–36.0)
MCV: 95.7 fL (ref 80.0–100.0)
Platelets: 228 10*3/uL (ref 150–400)
RBC: 3.93 MIL/uL (ref 3.87–5.11)
RDW: 11.8 % (ref 11.5–15.5)
WBC: 7.7 10*3/uL (ref 4.0–10.5)
nRBC: 0 % (ref 0.0–0.2)

## 2020-06-20 SURGERY — DILATION AND EVACUATION, UTERUS
Anesthesia: Monitor Anesthesia Care | Site: Uterus

## 2020-06-20 MED ORDER — LACTATED RINGERS IV SOLN: INTRAVENOUS | Status: DC

## 2020-06-20 MED ORDER — OXYCODONE HCL 5 MG PO TABS: 5.0000 mg | ORAL_TABLET | Freq: Four times a day (QID) | ORAL | 0 refills | Status: AC | PRN

## 2020-06-20 MED ORDER — MIDAZOLAM HCL 2 MG/2ML IJ SOLN
INTRAMUSCULAR | Status: AC
  Filled 2020-06-20: qty 2

## 2020-06-20 MED ORDER — FENTANYL CITRATE (PF) 100 MCG/2ML IJ SOLN
25.0000 ug | INTRAMUSCULAR | Status: DC | PRN
Start: 2020-06-20 — End: 2020-06-20

## 2020-06-20 MED ORDER — ONDANSETRON HCL 4 MG/2ML IJ SOLN
INTRAMUSCULAR | Status: AC
  Filled 2020-06-20: qty 2

## 2020-06-20 MED ORDER — DEXMEDETOMIDINE HCL 200 MCG/2ML IV SOLN
INTRAVENOUS | Status: DC | PRN
  Administered 2020-06-20 (×2): 4 ug via INTRAVENOUS

## 2020-06-20 MED ORDER — BUPIVACAINE HCL (PF) 0.25 % IJ SOLN
INTRAMUSCULAR | Status: DC | PRN
  Administered 2020-06-20: 10 mL

## 2020-06-20 MED ORDER — KETOROLAC TROMETHAMINE 30 MG/ML IJ SOLN
INTRAMUSCULAR | Status: AC
  Filled 2020-06-20: qty 1

## 2020-06-20 MED ORDER — PROPOFOL 500 MG/50ML IV EMUL
INTRAVENOUS | Status: DC | PRN
  Administered 2020-06-20: 50 ug/kg/min via INTRAVENOUS

## 2020-06-20 MED ORDER — KETOROLAC TROMETHAMINE 30 MG/ML IJ SOLN
INTRAMUSCULAR | Status: DC | PRN
  Administered 2020-06-20: 30 mg via INTRAVENOUS

## 2020-06-20 MED ORDER — DOXYCYCLINE HYCLATE 100 MG IV SOLR
200.0000 mg | Freq: Once | INTRAVENOUS | Status: AC
  Administered 2020-06-20: 13:00:00 200 mg via INTRAVENOUS
  Filled 2020-06-20: qty 200

## 2020-06-20 MED ORDER — MIDAZOLAM HCL 5 MG/5ML IJ SOLN
INTRAMUSCULAR | Status: DC | PRN
  Administered 2020-06-20: 2 mg via INTRAVENOUS
  Administered 2020-06-20 (×2): 1 mg via INTRAVENOUS

## 2020-06-20 MED ORDER — FENTANYL CITRATE (PF) 100 MCG/2ML IJ SOLN
INTRAMUSCULAR | Status: AC
  Filled 2020-06-20: qty 2

## 2020-06-20 MED ORDER — OXYCODONE HCL 5 MG PO TABS: 5.0000 mg | ORAL_TABLET | Freq: Once | ORAL | Status: DC | PRN

## 2020-06-20 MED ORDER — DEXAMETHASONE SODIUM PHOSPHATE 4 MG/ML IJ SOLN
INTRAMUSCULAR | Status: DC | PRN
  Administered 2020-06-20: 5 mg via INTRAVENOUS

## 2020-06-20 MED ORDER — OXYCODONE HCL 5 MG/5ML PO SOLN: 5.0000 mg | Freq: Once | ORAL | Status: DC | PRN

## 2020-06-20 MED ORDER — LIDOCAINE HCL (PF) 2 % IJ SOLN
INTRAMUSCULAR | Status: AC
  Filled 2020-06-20: qty 5

## 2020-06-20 MED ORDER — FENTANYL CITRATE (PF) 100 MCG/2ML IJ SOLN
INTRAMUSCULAR | Status: DC | PRN
  Administered 2020-06-20 (×3): 12.5 ug via INTRAVENOUS
  Administered 2020-06-20: 25 ug via INTRAVENOUS
  Administered 2020-06-20: 12.5 ug via INTRAVENOUS
  Administered 2020-06-20: 25 ug via INTRAVENOUS

## 2020-06-20 MED ORDER — DEXAMETHASONE SODIUM PHOSPHATE 10 MG/ML IJ SOLN
INTRAMUSCULAR | Status: AC
  Filled 2020-06-20: qty 1

## 2020-06-20 MED ORDER — LIDOCAINE HCL (CARDIAC) PF 100 MG/5ML IV SOSY
PREFILLED_SYRINGE | INTRAVENOUS | Status: DC | PRN
  Administered 2020-06-20: 60 mg via INTRAVENOUS

## 2020-06-20 MED ORDER — DEXMEDETOMIDINE (PRECEDEX) IN NS 20 MCG/5ML (4 MCG/ML) IV SYRINGE
PREFILLED_SYRINGE | INTRAVENOUS | Status: AC
  Filled 2020-06-20: qty 5

## 2020-06-20 MED ORDER — IBUPROFEN 800 MG PO TABS: 800.0000 mg | ORAL_TABLET | Freq: Three times a day (TID) | ORAL | 0 refills | Status: AC | PRN

## 2020-06-20 MED ORDER — ONDANSETRON HCL 4 MG/2ML IJ SOLN
INTRAMUSCULAR | Status: DC | PRN
  Administered 2020-06-20: 4 mg via INTRAVENOUS

## 2020-06-20 MED ORDER — SILVER NITRATE-POT NITRATE 75-25 % EX MISC
CUTANEOUS | Status: DC | PRN
  Administered 2020-06-20: 3 via TOPICAL

## 2020-06-20 MED ORDER — PROPOFOL 10 MG/ML IV BOLUS
INTRAVENOUS | Status: DC | PRN
  Administered 2020-06-20 (×3): 20 mg via INTRAVENOUS

## 2020-06-20 MED ORDER — PROMETHAZINE HCL 25 MG/ML IJ SOLN: 6.2500 mg | INTRAMUSCULAR | Status: DC | PRN

## 2020-06-20 MED ORDER — PROPOFOL 500 MG/50ML IV EMUL
INTRAVENOUS | Status: AC
  Filled 2020-06-20: qty 50

## 2020-06-20 SURGICAL SUPPLY — 20 items
CATH ROBINSON RED A/P 16FR (CATHETERS) ×4 IMPLANT
DECANTER SPIKE VIAL GLASS SM (MISCELLANEOUS) ×4 IMPLANT
FILTER UTR ASPR ASSEMBLY (MISCELLANEOUS) ×4 IMPLANT
GLOVE BIO SURGEON STRL SZ7.5 (GLOVE) ×4 IMPLANT
GLOVE BIOGEL PI IND STRL 7.0 (GLOVE) ×2 IMPLANT
GLOVE BIOGEL PI INDICATOR 7.0 (GLOVE) ×2
GOWN STRL REUS W/ TWL LRG LVL3 (GOWN DISPOSABLE) ×4 IMPLANT
GOWN STRL REUS W/TWL LRG LVL3 (GOWN DISPOSABLE) ×8
KIT BERKELEY 1ST TRIMESTER 3/8 (MISCELLANEOUS) ×4 IMPLANT
KIT TURNOVER CYSTO (KITS) ×4 IMPLANT
NS IRRIG 1000ML POUR BTL (IV SOLUTION) ×4 IMPLANT
PACK VAGINAL MINOR WOMEN LF (CUSTOM PROCEDURE TRAY) ×4 IMPLANT
PAD OB MATERNITY 4.3X12.25 (PERSONAL CARE ITEMS) ×4 IMPLANT
SET BERKELEY SUCTION TUBING (SUCTIONS) ×4 IMPLANT
TOWEL OR 17X26 10 PK STRL BLUE (TOWEL DISPOSABLE) ×8 IMPLANT
UNDERPAD 30X36 HEAVY ABSORB (UNDERPADS AND DIAPERS) ×4 IMPLANT
VACURETTE 10 RIGID CVD (CANNULA) IMPLANT
VACURETTE 7MM CVD STRL WRAP (CANNULA) IMPLANT
VACURETTE 8 RIGID CVD (CANNULA) ×4 IMPLANT
VACURETTE 9 RIGID CVD (CANNULA) IMPLANT

## 2020-06-20 NOTE — Anesthesia Postprocedure Evaluation (Signed)
Anesthesia Post Note  Patient: Tammy Holmes  Procedure(s) Performed: Suction DILATATION AND EVACUATION (N/A Uterus)     Patient location during evaluation: PACU Anesthesia Type: MAC Level of consciousness: awake and alert Pain management: pain level controlled Vital Signs Assessment: post-procedure vital signs reviewed and stable Respiratory status: spontaneous breathing, nonlabored ventilation and respiratory function stable Cardiovascular status: stable and blood pressure returned to baseline Anesthetic complications: no   No complications documented.  Last Vitals:  Vitals:   06/20/20 1345 06/20/20 1400  BP: (!) 87/44 (!) 87/44  Pulse: (!) 50 (!) 51  Resp: 13 15  Temp:    SpO2: 99% 100%    Last Pain:  Vitals:   06/20/20 1400  TempSrc:   PainSc: 0-No pain                 Audry Pili

## 2020-06-20 NOTE — Anesthesia Procedure Notes (Signed)
Procedure Name: MAC Date/Time: 06/20/2020 12:30 PM Performed by: Justice Rocher, CRNA Pre-anesthesia Checklist: Patient identified, Emergency Drugs available, Suction available, Patient being monitored and Timeout performed Patient Re-evaluated:Patient Re-evaluated prior to induction Oxygen Delivery Method: Simple face mask Preoxygenation: Pre-oxygenation with 100% oxygen Induction Type: IV induction Placement Confirmation: breath sounds checked- equal and bilateral,  CO2 detector and positive ETCO2

## 2020-06-20 NOTE — Op Note (Signed)
Operative Note  Tammy Holmes [id number] [birth year]   PROCEDURE: dilation and vacuum curettage   PRE-OPERATIVE DIAGNOSIS: molar pregnancy  POST-OPERATIVE DIAGNOSIS: molar pregnancy  SURGEON: Dr. Langley Gauss, DO  ASSISTANT: N/Tammy  FINDINGS: normal appearing female genitalia and parous cervix without lesions, US guidance revealed enlarged gravid uterus consistent with 7-8 week size with no evidence of IUP, increased heterogenous endometrial mass suspicious for molar pregnancy  SPECIMENS: products of conception, molar pregnancy  EBL: 10 cc  FLUIDS: 700 cc crystalloids   COMPLICATIONS: None  PROCEDURE IN DETAIL:   After the patient was appropriately consented in the holding area, she was taken to the operating room where MAC anesthesia was administered without complications. The patient was placed in the dorsal lithotomy position. The patient was prepped and draped in the usual sterile fashion. The bladder was trained with Tammy catheter. An appropriate time out was performed that verified the correct patient, procedure, and surgical team.   Tammy sterile speculum was inserted into the vagina and the cervix was visualized. Tammy single tooth tenaculum was used to grasp the anterior lip of the cervix. Tammy paracervical block was obtained using 1% plain lidocaine injecting Tammy total of 10 cc divided between the 4 and 8 o'clock positions. The cervix was serially dilated up to Tammy size 24 Pratt. Tammy size 8 mm suction curette was attached to the suction tubing and gently advanced to the uterine fundus under US guidance. The curette was rotated in Tammy counterclockwise fashion under US guidance until Tammy thin endometrial stripe was appreciated, measuring 7 mm. This was accomplished with 2 passes. Tammy final gentle sharp curetting was performed. The products of conception were sent for final pathology. Excellent hemostasis was appreciated. The tenaculum was removed and some mild bleeding noted from tenaculum site which was  resolved with silver nitrate application. Speculum was removed. Patient tolerated the procedure well and was taken to the recovery room in stable condition. All instrument and lap counts were correct. Patient blood type Rh POS.   Tammy Holmes Tammy Holmes 06/20/20 1:32 PM

## 2020-06-20 NOTE — H&P (Signed)
Tammy Holmes is an 29 y.o. female. [name] presents for scheduled D&C procedure. Patient had been seen in office on 06/15/20 for a confirmation of pregnancy [place]. Planned/desired pregnancy. On [place] evaluation she was found to have no evidence of GS/YS/FP or FHR, but rather a slightly vascular heterogeneous mass measuring 50 mm with classic snow storm appearance within the endometrium. Normal appearing adnexa with no evidence of masses on bilateral ovaries. The beta quant measured on 12/15 had been well above the threshold, at 69,948. She was counseled on findings consistent with molar pregnancy and recommendation for surgical management with D&C procedure. Patient history significant for 1 prior SAB, no interventions required.  No prior surgeries and no additional medical problems.  She was prescribed Ativan PRN after devastating news of non-viable pregnancy, but no other daily prescription medications.  She had some light spotting and cramping but otherwise asymptomatic. Normal BP and no evidence of preE or thyroid disorders.    Menstrual History: Patient's last menstrual period was 04/18/2020 (approximate).    Past Medical History:  Diagnosis Date  . Medical history non-contributory   . Pregnancy, molar    possible    Past Surgical History:  Procedure Laterality Date  . NO PAST SURGERIES      Family History  Problem Relation Age of Onset  . Diabetes Mother   . Hyperlipidemia Mother   . Hypertension Mother   . Hyperlipidemia Maternal Grandmother   . Hypertension Maternal Grandmother   . Stroke Maternal Grandmother     Social History:  reports that she has never smoked. She has never used smokeless tobacco. She reports previous alcohol use. She reports that she does not use drugs.  Allergies: No Known Allergies  No medications prior to admission.    Review of Systems  All other systems reviewed and are negative.   Blood pressure 112/71, pulse 66, temperature 99.6  F (37.6 C), temperature source Oral, resp. rate 14, height 5\' 4"  (1.626 m), weight 64.4 kg, last menstrual period 04/18/2020, SpO2 100 %, currently breastfeeding. Physical Exam Vitals reviewed.  Constitutional:      Appearance: Normal appearance. She is normal weight.  HENT:     Head: Normocephalic.  Cardiovascular:     Rate and Rhythm: Normal rate.  Pulmonary:     Effort: Pulmonary effort is normal.  Abdominal:     General: Abdomen is flat.     Palpations: Abdomen is soft.  Musculoskeletal:     Cervical back: Normal range of motion.  Skin:    General: Skin is warm and dry.  Neurological:     General: No focal deficit present.     Mental Status: She is alert and oriented to person, place, and time.  Psychiatric:        Mood and Affect: Mood normal.        Behavior: Behavior normal.     Results for orders placed or performed during the hospital encounter of 06/20/20 (from the past 24 hour(s))  CBC     Status: None   Collection Time: 06/20/20 11:20 AM  Result Value Ref Range   WBC 7.7 4.0 - 10.5 K/uL   RBC 3.93 3.87 - 5.11 MIL/uL   Hemoglobin 12.9 12.0 - 15.0 g/dL   HCT 06/22/20 98.2 - 64.1 %   MCV 95.7 80.0 - 100.0 fL   MCH 32.8 26.0 - 34.0 pg   MCHC 34.3 30.0 - 36.0 g/dL   RDW 58.3 09.4 - 07.6 %   Platelets  228 150 - 400 K/uL   nRBC 0.0 0.0 - 0.2 %    Assessment/Plan: 30H [id number] with suspected molar pregnancy. Patient consented for D&C procedure. Review of risks and benefits reviewed. Patient aware of risks of bleeding, agrees to blood transfusion if required, infection, and damage to surrounding organs. Aware of rare risk of hysterectomy if bleeding uncontrolled with medical management and less invasive therapies.   -NPO -IV Doxycycline 200 mg antibiotic prophylaxis  -SCD VTE ppx -US guidance -Pathology to specify suspicion for molar pregnancy -Close outpatient follow up with serial beta hCG discussed -Anticipate discharge home after procedure -Blood type O POS,  no rhogam indicated -Routine intraop/postop care  [name] A Brannan Cassedy 06/20/2020, 11:43 AM

## 2020-06-20 NOTE — Anesthesia Preprocedure Evaluation (Addendum)
Anesthesia Evaluation  Patient identified by MRN, date of birth, ID band Patient awake    Reviewed: Allergy & Precautions, NPO status , Patient's Chart, lab work & pertinent test results  History of Anesthesia Complications Negative for: history of anesthetic complications  Airway Mallampati: II  TM Distance: >3 FB Neck ROM: Full    Dental  (+) Dental Advisory Given, Teeth Intact   Pulmonary neg pulmonary ROS,    Pulmonary exam normal        Cardiovascular negative cardio ROS Normal cardiovascular exam     Neuro/Psych negative neurological ROS  negative psych ROS   GI/Hepatic Neg liver ROS, GERD  Controlled,  Endo/Other  negative endocrine ROS  Renal/GU negative Renal ROS     Musculoskeletal negative musculoskeletal ROS (+)   Abdominal   Peds  Hematology negative hematology ROS (+)   Anesthesia Other Findings Covid+ 06/08/20 - mild symptoms beginning 11/30, but did not get tested until 12/10 when she lost taste/smell. All symptoms since resolved.    Reproductive/Obstetrics  Molar pregnancy                             Anesthesia Physical Anesthesia Plan  ASA: II  Anesthesia Plan: MAC   Post-op Pain Management:    Induction: Intravenous  PONV Risk Score and Plan: 2 and Propofol infusion and Treatment may vary due to age or medical condition  Airway Management Planned: Natural Airway and Simple Face Mask  Additional Equipment: None  Intra-op Plan:   Post-operative Plan:   Informed Consent: I have reviewed the patients History and Physical, chart, labs and discussed the procedure including the risks, benefits and alternatives for the proposed anesthesia with the patient or authorized representative who has indicated his/her understanding and acceptance.       Plan Discussed with: CRNA and Anesthesiologist  Anesthesia Plan Comments:        Anesthesia Quick  Evaluation

## 2020-06-20 NOTE — Transfer of Care (Addendum)
Immediate Anesthesia Transfer of Care Note  Patient: Tammy Holmes  Procedure(s) Performed: Procedure(s) (LRB): Suction DILATATION AND EVACUATION (N/A)  Patient Location: PACU  Anesthesia Type: MAC  Level of Consciousness: awake, sedated, patient cooperative and responds to stimulation  Airway & Oxygen Therapy: Patient Spontanous Breathing and Patient connected to FM 02 and soft FM   Post-op Assessment: Report given to PACU RN, Post -op Vital signs reviewed and stable and Patient moving all extremities  Post vital signs: Reviewed and stable  Complications: No apparent anesthesia complications

## 2020-06-20 NOTE — Discharge Instructions (Signed)

## 2020-06-25 ENCOUNTER — Encounter (HOSPITAL_BASED_OUTPATIENT_CLINIC_OR_DEPARTMENT_OTHER): Payer: Self-pay | Admitting: Obstetrics and Gynecology

## 2020-07-17 ENCOUNTER — Encounter (HOSPITAL_COMMUNITY): Payer: Self-pay

## 2020-07-17 LAB — SURGICAL PATHOLOGY

## 2022-03-25 IMAGING — US US OB COMP LESS 14 WK
1 series · 13 of 28 positions shown · non-contrast
Comparison: None

CLINICAL DATA: First trimester pregnancy, molar pregnancy, planned
dilatation and evacuation, assess for fetal heart rate; LMP
04/18/2020

EXAM:
OBSTETRIC <14 WK ULTRASOUND
TECHNIQUE: Transabdominal ultrasound was performed for evaluation of the
gestation as well as the maternal uterus and adnexal regions.

[Series 1: us ob comp less 14 wk · 13 of 38 slices shown]
[im 2/38]
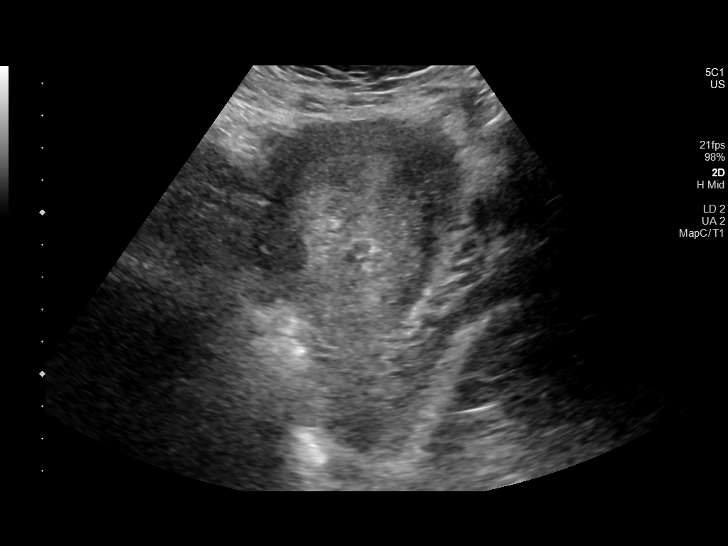
[im 5/38]
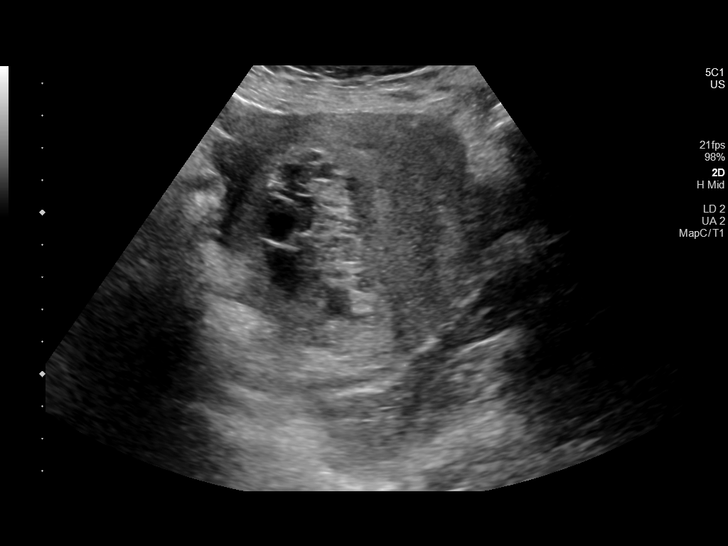
[im 7/38]
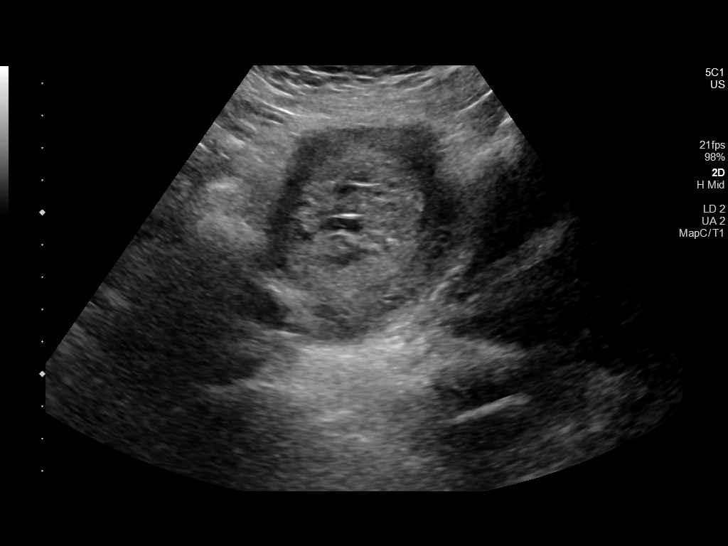
[im 10/38]
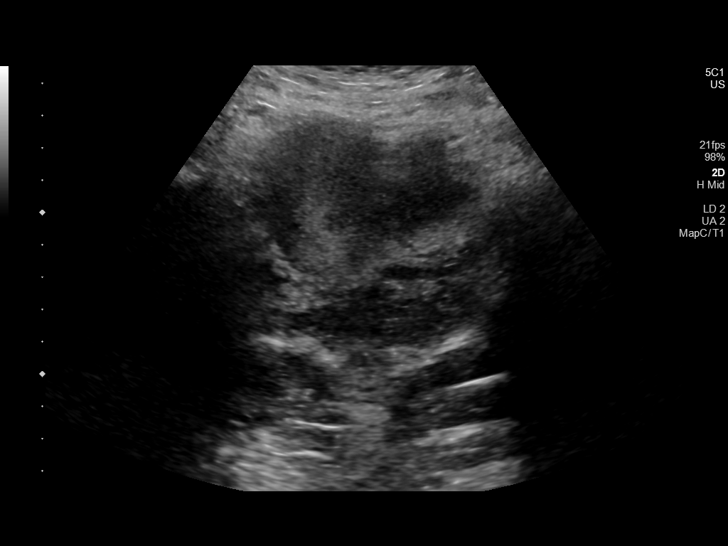
[im 13/38]
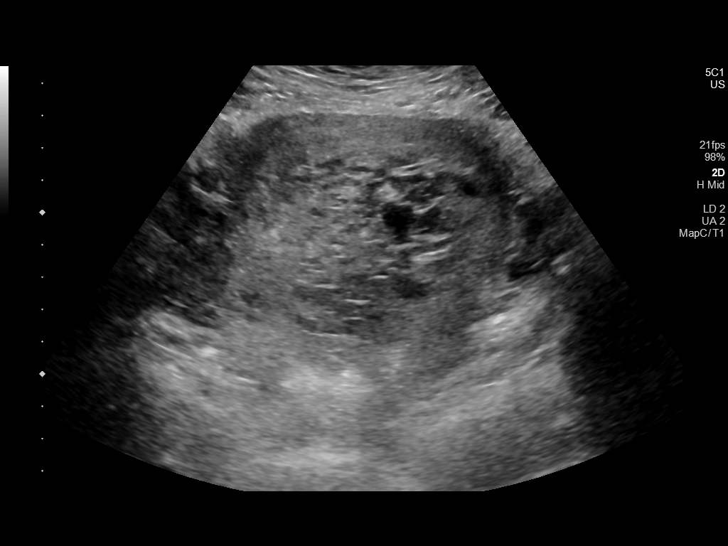
[im 16/38]
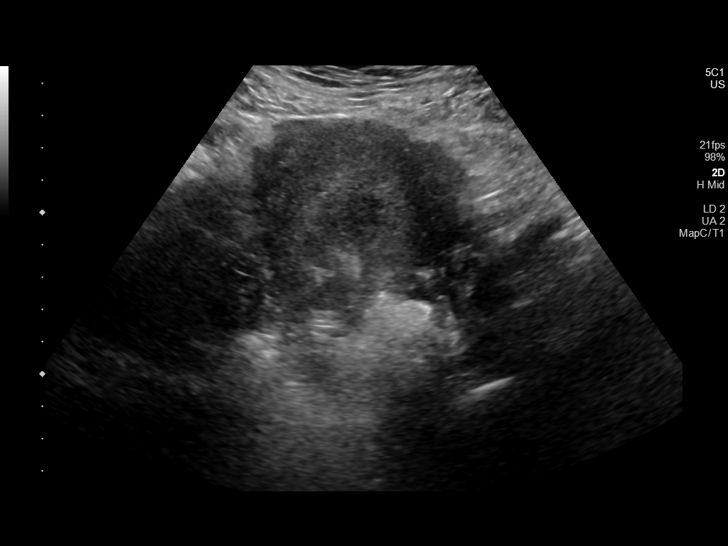
[im 20/38]
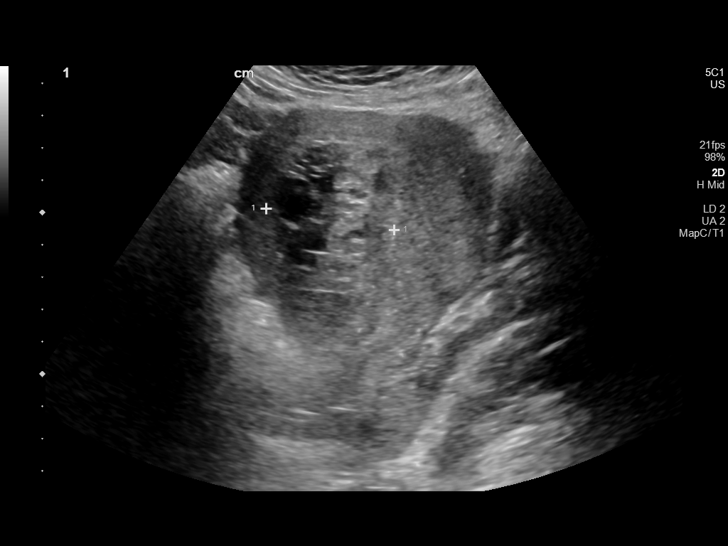
[im 22/38]
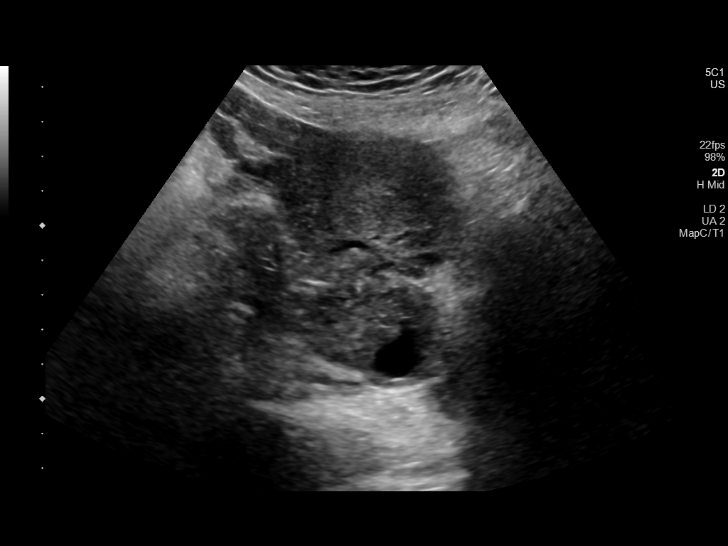
[im 25/38]
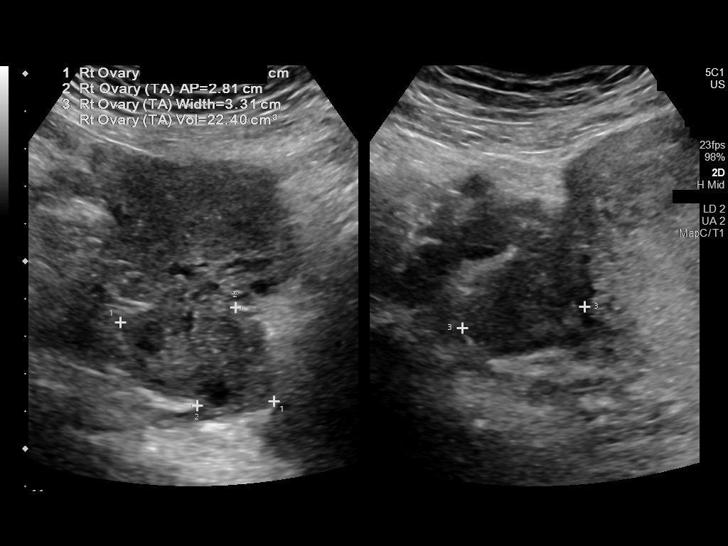
[im 28/38]
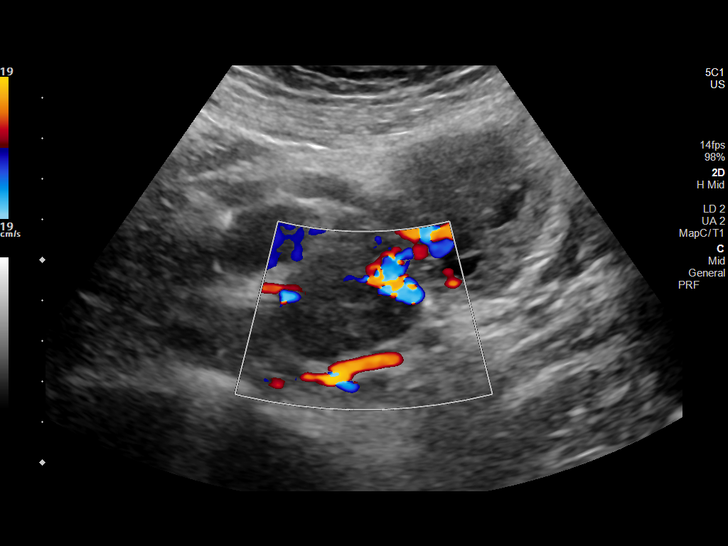
[im 31/38]
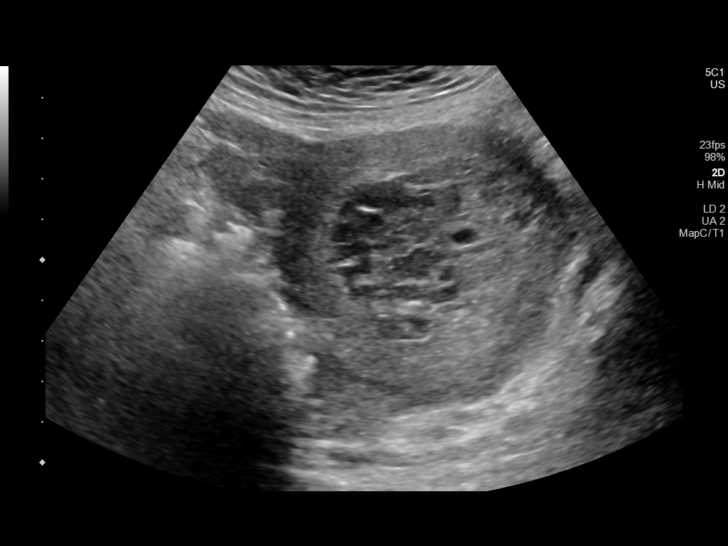
[im 33/38]
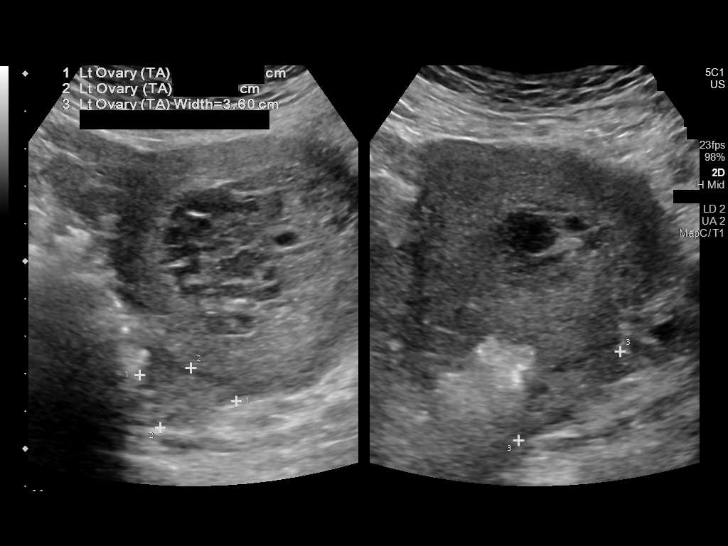
[im 36/38]
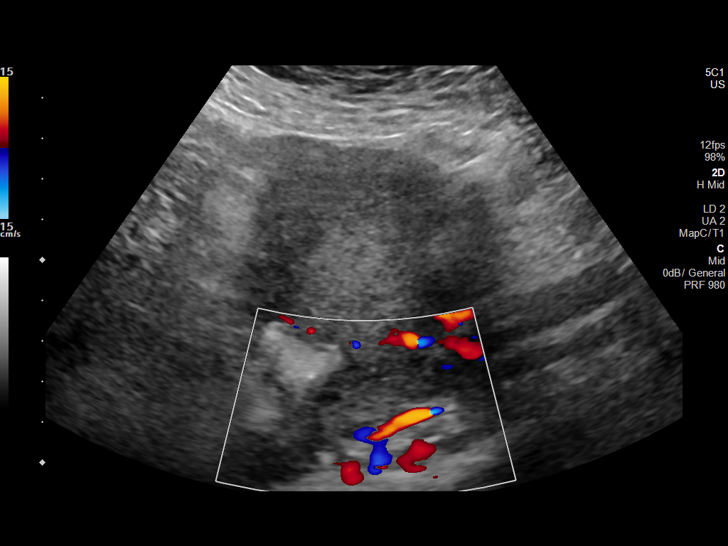

[13 of 28 positions shown; findings below may reference images not displayed]

FINDINGS: Intrauterine gestational sac: Absent

Yolk sac:  N/A

Embryo:  N/A

Cardiac Activity: N/A

Heart Rate: N/A bpm

MSD:    mm    w     d

CRL:     mm    w  d                  US EDC:

Subchorionic hemorrhage:  N/A

Maternal uterus/adnexae:

Uterus anteverted.

No discrete gestational sac identified.

No fetal pole or yolk sac.

Within endometrial complex, a complex multi-cystic mass is
identified containing areas of cystic change, septations, and
abnormal soft tissue measuring approximately 5.3 x 4.9 x 6.8 cm
consistent with a molar pregnancy.

No additional uterine masses.

RIGHT ovary normal size and morphology 4.6 x 2.8 x 3.3 cm.

LEFT ovary normal size and morphology, 2.7 x 1.8 x 3.6 cm.

No free pelvic fluid or adnexal masses.
IMPRESSION: No normal appearing intrauterine gestational sac or fetal contents
identified.

Heterogeneous multi-cystic mass within central uterus 5.3 x 4.8 x
6.8 cm consistent with known molar pregnancy.
